# Patient Record
Sex: Male | Born: 1975 | Race: White | Hispanic: No | Marital: Married | Smoking: Former smoker
Health system: Southern US, Community
[De-identification: ages and names within clinical notes are randomized; demographics above are authoritative.]

## PROBLEM LIST (undated history)

## (undated) DIAGNOSIS — F329 Major depressive disorder, single episode, unspecified: Secondary | ICD-10-CM

## (undated) DIAGNOSIS — IMO0002 Reserved for concepts with insufficient information to code with codable children: Secondary | ICD-10-CM

## (undated) DIAGNOSIS — M1990 Unspecified osteoarthritis, unspecified site: Secondary | ICD-10-CM

## (undated) DIAGNOSIS — F32A Depression, unspecified: Secondary | ICD-10-CM

## (undated) DIAGNOSIS — F319 Bipolar disorder, unspecified: Secondary | ICD-10-CM

## (undated) DIAGNOSIS — F419 Anxiety disorder, unspecified: Secondary | ICD-10-CM

## (undated) DIAGNOSIS — E785 Hyperlipidemia, unspecified: Secondary | ICD-10-CM

## (undated) DIAGNOSIS — F1020 Alcohol dependence, uncomplicated: Secondary | ICD-10-CM

## (undated) DIAGNOSIS — K219 Gastro-esophageal reflux disease without esophagitis: Secondary | ICD-10-CM

## (undated) HISTORY — DX: Unspecified osteoarthritis, unspecified site: M19.90

## (undated) HISTORY — DX: Anxiety disorder, unspecified: F41.9

## (undated) HISTORY — DX: Hyperlipidemia, unspecified: E78.5

## (undated) HISTORY — DX: Alcohol dependence, uncomplicated: F10.20

## (undated) HISTORY — DX: Reserved for concepts with insufficient information to code with codable children: IMO0002

## (undated) HISTORY — DX: Depression, unspecified: F32.A

## (undated) HISTORY — DX: Gastro-esophageal reflux disease without esophagitis: K21.9

## (undated) HISTORY — PX: WISDOM TOOTH EXTRACTION: SHX21

## (undated) HISTORY — DX: Major depressive disorder, single episode, unspecified: F32.9

---

## 2008-04-25 ENCOUNTER — Emergency Department (HOSPITAL_COMMUNITY): Admission: EM | Admit: 2008-04-25 | Discharge: 2008-04-25 | Payer: Self-pay | Admitting: Emergency Medicine

## 2008-11-06 ENCOUNTER — Emergency Department (HOSPITAL_COMMUNITY): Admission: EM | Admit: 2008-11-06 | Discharge: 2008-11-06 | Payer: Self-pay | Admitting: Emergency Medicine

## 2009-05-31 ENCOUNTER — Ambulatory Visit: Payer: Self-pay | Admitting: Family Medicine

## 2009-05-31 DIAGNOSIS — R209 Unspecified disturbances of skin sensation: Secondary | ICD-10-CM | POA: Insufficient documentation

## 2009-05-31 DIAGNOSIS — R5381 Other malaise: Secondary | ICD-10-CM

## 2009-05-31 DIAGNOSIS — R0609 Other forms of dyspnea: Secondary | ICD-10-CM | POA: Insufficient documentation

## 2009-05-31 DIAGNOSIS — R0989 Other specified symptoms and signs involving the circulatory and respiratory systems: Secondary | ICD-10-CM

## 2009-05-31 DIAGNOSIS — R5383 Other fatigue: Secondary | ICD-10-CM | POA: Insufficient documentation

## 2009-05-31 DIAGNOSIS — IMO0002 Reserved for concepts with insufficient information to code with codable children: Secondary | ICD-10-CM | POA: Insufficient documentation

## 2009-06-10 ENCOUNTER — Ambulatory Visit: Payer: Self-pay | Admitting: Family Medicine

## 2009-06-10 DIAGNOSIS — E559 Vitamin D deficiency, unspecified: Secondary | ICD-10-CM | POA: Insufficient documentation

## 2009-06-10 LAB — CONVERTED CEMR LAB
AST: 19 units/L (ref 0–37)
Albumin: 4.6 g/dL (ref 3.5–5.2)
Alkaline Phosphatase: 46 units/L (ref 39–117)
Basophils Absolute: 0 10*3/uL (ref 0.0–0.1)
Basophils Relative: 0 % (ref 0–1)
Calcium: 9.4 mg/dL (ref 8.4–10.5)
Chloride: 103 meq/L (ref 96–112)
Creatinine, Ser: 0.88 mg/dL (ref 0.40–1.50)
HDL: 45 mg/dL (ref >39)
Hemoglobin: 14.9 g/dL (ref 13.0–17.0)
Lymphocytes Relative: 31 % (ref 12–46)
MCHC: 34 g/dL (ref 30.0–36.0)
Monocytes Absolute: 0.6 10*3/uL (ref 0.1–1.0)
Monocytes Relative: 8 % (ref 3–12)
Neutro Abs: 4.4 10*3/uL (ref 1.7–7.7)
Neutrophils Relative %: 60 % (ref 43–77)
RBC: 4.92 M/uL (ref 4.22–5.81)
Sed Rate: 3 mm/hr (ref 0–16)
Sodium: 139 meq/L (ref 135–145)
TSH: 0.917 microintl units/mL (ref 0.350–4.500)
Total CHOL/HDL Ratio: 3.4
Total Protein: 7.4 g/dL (ref 6.0–8.3)
Triglycerides: 91 mg/dL (ref <150)

## 2010-04-22 NOTE — Assessment & Plan Note (Signed)
Summary: NEW PATIENT PHYSICAL///SPH   Vital Signs:  Patient profile:   35 year old male Height:      74 inches Weight:      192 pounds BMI:     24.74 Temp:     98.1 degrees F oral Pulse rate:   72 / minute Pulse rhythm:   regular BP sitting:   126 / 84  (left arm) Cuff size:   regular  Vitals Entered By: Army Fossa CMA (May 31, 2009 2:17 PM) CC: Pt here for to establish and CPX, having a numbing sensation on outside of his leg.    History of Present Illness: Pt here to establish.  Pt had an injury at work when he picked something up and he saw a Dr in [place].   This occurred six years ago.  Pt c/o fatigue and numbnesss in hands and legs.  Pt feels like this has been getting progressivley worse. Pt wife states he snores a lot.  Back aggrevated back about 1 month ago.  He is not sure how he injured it  but it is progressively getting worse.  Pt works for [employer]---  He doesn't lift anything heavy or operate heavy machinery.   Pt tried PT 6 years ago.    Preventive Screening-Counseling & Management  Alcohol-Tobacco     Alcohol drinks/day: <1     Alcohol type: beer     Smoking Status: current     Smoking Cessation Counseling: yes     Smoke Cessation Stage: ready     Packs/Day: 0.5     Year Started: 1997  Caffeine-Diet-Exercise     Caffeine use/day: 2     Does Patient Exercise: yes     Times/week: <3---- 1x a week      Sexual History:  currently monogamous.        Drug Use:  no.    Current Medications (verified): 1)  Depakote 500 Mg Tbec (Divalproex Sodium) .... 2 By Mouth Qpm 2)  Lamictal Xr 100 Mg Xr24h-Tab (Lamotrigine) .Marland Kitchen.. 1 By Mouth At Bedtime 3)  Mobic 15 Mg Tabs (Meloxicam) .Marland Kitchen.. 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 Family History of Arthritis Family History of Colon CA 1st degree relative <60 Family History Diabetes 1st degree relative Family History Lung cancer Family History of Prostate CA 1st degree relative <50  Social History: Reviewed history and no changes required. 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 MS:  Complains of low back pain; denies  joint pain, joint redness, joint swelling, loss of strength, mid back pain, muscle aches, muscle , cramps, muscle weakness, stiffness, and thoracic pain. Psych:  Complains of depression; crossroads--. Allergy:  Denies hives or rash, itching eyes, persistent infections, seasonal allergies, and sneezing.  Physical Exam  General:  Well-developed,well-nourished,in no acute distress; alert,appropriate and cooperative throughout examination Neck:  No deformities, masses, or tenderness noted. Lungs:  Normal respiratory effort, chest expands symmetrically. Lungs are clear to auscultation, no crackles or  wheezes. Heart:  Normal rate and regular rhythm. S1 and S2 normal without gallop, murmur, click, rub or other extra sounds. Msk:  thumb and 1st finger numb with flexion of wrist-hand handed Extremities:  No clubbing, cyanosis, edema, or deformity noted with normal full range of motion of all joints.   Neurologic:  alert & oriented X3, strength normal in all extremities, gait normal, and DTRs symmetrical and normal.   Skin:  Intact without suspicious lesions or rashes Cervical Nodes:  No lymphadenopathy noted Psych:  Oriented X3, normally interactive, and good eye contact.     Impression & Recommendations:  Problem # 1:  NUMBNESS, HAND (ICD-782.0)  Orders: Venipuncture (56213) T-Antinuclear Antib (ANA) (08657-84696) Ankle / Wrist Splint (E9528)  Problem # 2:  BACK PAIN WITH RADICULOPATHY (ICD-729.2)  Orders: Venipuncture (41324) T-Antinuclear Antib (ANA) (40102-72536) T-Lumbar Spine 2 Views (72100TC) T-Vitamin D (25-Hydroxy) (64403-47425)  Problem # 3:  FATIGUE (ICD-780.79)  Orders: Venipuncture (95638) T-Antinuclear Antib (ANA) [phone]) T-Vitamin D (25-Hydroxy) (88416-60630) Sleep Disorder Referral (Sleep Disorder)  Problem # 4:  SNORING (ICD-786.09)  Orders: Sleep Disorder Referral (Sleep Disorder)  Complete Medication List: 1)  Depakote 500 Mg Tbec (Divalproex sodium) .... 2 by mouth qpm 2)  Lamictal Xr 100 Mg Xr24h-tab (Lamotrigine) .Marland Kitchen.. 1 by mouth at bedtime 3)  Mobic 15 Mg Tabs (Meloxicam) .Marland Kitchen.. 1 by mouth once daily Prescriptions: MOBIC 15 MG TABS (MELOXICAM) 1 by mouth once daily  #30 x 2   Entered and Authorized by:   Loreen Freud DO   Signed by:   Loreen Freud DO on 05/31/2009   Method used:   Electronically to        [facility] [phone]* (retail)       [address]       [place], [place]  [address]       Ph: [phone]       Fax: [phone]   RxID:   [id number]    Immunization History:  Tetanus/Td  Immunization History:    Tetanus/Td:  Historical (05/30/2003)

## 2010-04-22 NOTE — Assessment & Plan Note (Signed)
Summary: cpx//lch   Vital Signs:  Patient profile:   35 year old male Temp:     190 degrees F Pulse rate:   78 / minute Pulse rhythm:   regular BP sitting:   124 / 80  (left arm) Cuff size:   regular  Vitals Entered By: Army Fossa CMA (June 10, 2009 8:53 AM) CC: CPX, no complaints.    History of Present Illness: Pt here for cpe .  Labs reviewed with patient.  No new complaints.    Preventive Screening-Counseling & Management  Alcohol-Tobacco     Alcohol drinks/day: <1     Alcohol type: beer     Smoking Status: current     Smoking Cessation Counseling: yes     Smoke Cessation Stage: ready     Packs/Day: 0.5     Year Started: 1997  Caffeine-Diet-Exercise     Caffeine use/day: 2     Does Patient Exercise: yes     Times/week: <3---- 1x a week  Hep-HIV-STD-Contraception     Dental Visit-last 6 months no     Dental Care Counseling: to seek dental care; no dental care within six months      Sexual History:  currently monogamous and married-- 1 child.    Current Medications (verified): 1)  Depakote 500 Mg Tbec (Divalproex Sodium) .... 2 By Mouth Qpm 2)  Lamictal Xr 100 Mg Xr24h-Tab (Lamotrigine) .Marland Kitchen.. 1 By Mouth At Bedtime 3)  Mobic 15 Mg Tabs (Meloxicam) .Marland Kitchen.. 1 By Mouth Once Daily 4)  Vitamin D (Ergocalciferol) 50000 Unit Caps (Ergocalciferol) .... Take One Tablet Weekly  Allergies (verified): No Known Drug Allergies  Past History:  Past Medical History: Last updated: 05/31/2009 Arthritis Depression GERD Ulcers  Family History: Last updated: 05/31/2009 Family History of Arthritis Family History of Colon CA 1st degree relative <60 Family History Diabetes 1st degree relative Family History Lung cancer Family History of Prostate CA 1st degree relative <50  Social History: Last updated: 05/31/2009 Married Current Smoker Alcohol use-yes Drug use-no Regular exercise-no Occupation: Barrister's clerk--- apprentice/ electrician  Risk Factors: Alcohol Use:  <1 (06/10/2009) Caffeine Use: 2 (06/10/2009) Exercise: yes (06/10/2009)  Risk Factors: Smoking Status: current (06/10/2009) Packs/Day: 0.5 (06/10/2009)  Past Surgical History: wisdom teeth  Family History: Reviewed history from 05/31/2009 and no changes required. Family History of Arthritis Family History of Colon CA 1st degree relative <60 Family History Diabetes 1st degree relative Family History Lung cancer Family History of Prostate CA 1st degree relative <50  Social History: Reviewed history from 05/31/2009 and no changes required. Married Current Smoker Alcohol use-yes Drug use-no Regular exercise-no Occupation: Barrister's clerk--- apprentice/ electrician Dental Care w/in 6 mos.:  no Sexual History:  currently monogamous, married-- 1 child  Review of Systems      See HPI General:  Denies chills, fatigue, fever, loss of appetite, malaise, sleep disorder, sweats, weakness, and weight loss. Eyes:  Denies blurring, discharge, double vision, eye irritation, eye pain, halos, itching, light sensitivity, red eye, vision loss-1 eye, and vision loss-both eyes; optho--due. ENT:  Denies decreased hearing, difficulty swallowing, ear discharge, earache, hoarseness, nasal congestion, nosebleeds, postnasal drainage, ringing in ears, sinus pressure, and sore throat. CV:  Denies bluish discoloration of lips or nails, chest pain or discomfort, difficulty breathing at night, difficulty breathing while lying down, fainting, fatigue, leg cramps with exertion, lightheadness, near fainting, palpitations, shortness of breath with exertion, swelling of feet, swelling of hands, and weight gain. Resp:  Denies chest discomfort, chest pain with inspiration, cough,  coughing up blood, excessive snoring, hypersomnolence, morning headaches, pleuritic, shortness of breath, sputum productive, and wheezing. GI:  Denies abdominal pain, bloody stools, change in bowel habits, constipation, dark tarry stools,  diarrhea, excessive appetite, gas, hemorrhoids, indigestion, loss of appetite, nausea, vomiting, vomiting blood, and yellowish skin color. GU:  Denies decreased libido, discharge, dysuria, erectile dysfunction, genital sores, hematuria, incontinence, nocturia, urinary frequency, and urinary hesitancy. MS:  Complains of low back pain; denies joint pain, joint redness, joint swelling, loss of strength, mid back pain, muscle aches, muscle , cramps, muscle weakness, stiffness, and thoracic pain. Derm:  Denies changes in color of skin, changes in nail beds, dryness, excessive perspiration, flushing, hair loss, insect bite(s), itching, lesion(s), poor wound healing, and rash. Neuro:  Denies brief paralysis, difficulty with concentration, disturbances in coordination, falling down, headaches, inability to speak, memory loss, numbness, poor balance, seizures, sensation of room spinning, tingling, tremors, visual disturbances, and weakness. Psych:  Denies alternate hallucination ( auditory/visual), anxiety, depression, easily angered, easily tearful, irritability, mental problems, panic attacks, sense of great danger, suicidal thoughts/plans, thoughts of violence, unusual visions or sounds, and thoughts /plans of harming others. Endo:  Denies cold intolerance, excessive hunger, excessive thirst, excessive urination, heat intolerance, polyuria, and weight change. Heme:  Denies abnormal bruising, bleeding, enlarge lymph nodes, fevers, pallor, and skin discoloration. Allergy:  Denies hives or rash, itching eyes, persistent infections, seasonal allergies, and sneezing.  Physical Exam  General:  Well-developed,well-nourished,in no acute distress; alert,appropriate and cooperative throughout examination Head:  Normocephalic and atraumatic without obvious abnormalities. No apparent alopecia or balding. Eyes:  vision grossly intact, pupils equal, pupils round, pupils reactive to light, and no injection.   Ears:   External ear exam shows no significant lesions or deformities.  Otoscopic examination reveals clear canals, tympanic membranes are intact bilaterally without bulging, retraction, inflammation or discharge. Hearing is grossly normal bilaterally. Nose:  External nasal examination shows no deformity or inflammation. Nasal mucosa are pink and moist without lesions or exudates. Mouth:  Oral mucosa and oropharynx without lesions or exudates.  Teeth in good repair. Neck:  No deformities, masses, or tenderness noted. Chest Wall:  No deformities, masses, tenderness or gynecomastia noted. Lungs:  Normal respiratory effort, chest expands symmetrically. Lungs are clear to auscultation, no crackles or wheezes. Heart:  normal rate and no murmur.   Abdomen:  Bowel sounds positive,abdomen soft and non-tender without masses, organomegaly or hernias noted. Rectal:  No external abnormalities noted. Normal sphincter tone. No rectal masses or tenderness. Genitalia:  Testes bilaterally descended without nodularity, tenderness or masses. No scrotal masses or lesions. No penis lesions or urethral discharge. Msk:  normal ROM, no joint tenderness, no joint swelling, no joint warmth, no redness over joints, no joint deformities, no joint instability, and no crepitation.   Pulses:  R posterior tibial normal, R dorsalis pedis normal, R carotid normal, L posterior tibial normal, L dorsalis pedis normal, and L carotid normal.   Extremities:  No clubbing, cyanosis, edema, or deformity noted with normal full range of motion of all joints.   Neurologic:  No cranial nerve deficits noted. Station and gait are normal. Plantar reflexes are down-going bilaterally. DTRs are symmetrical throughout. Sensory, motor and coordinative functions appear intact. Skin:  Intact without suspicious lesions or rashes Cervical Nodes:  No lymphadenopathy noted Psych:  Cognition and judgment appear intact. Alert and cooperative with normal attention span  and concentration. No apparent delusions, illusions, hallucinations   Impression & Recommendations:  Problem # 1:  PREVENTIVE HEALTH  CARE (ICD-V70.0) labs reviewed with pt  Reviewed preventive care protocols, scheduled due services, and updated immunizations.  Problem # 2:  UNSPECIFIED VITAMIN D DEFICIENCY (ICD-268.9) vita D 3 2000u daily recheck 3 month  Complete Medication List: 1)  Depakote 500 Mg Tbec (Divalproex sodium) .... 2 by mouth qpm 2)  Lamictal Xr 100 Mg Xr24h-tab (Lamotrigine) .Marland Kitchen.. 1 by mouth at bedtime 3)  Mobic 15 Mg Tabs (Meloxicam) .Marland Kitchen.. 1 by mouth once daily 4)  Vitamin D (ergocalciferol) 50000 Unit Caps (Ergocalciferol) .... Take one tablet weekly 5)  Vitamin D3 2000 Unit Caps (Cholecalciferol) .Marland Kitchen.. 1 by mouth once daily  Patient Instructions: 1)  Follow up  in 3 months for Vitamin D and B12. 266.2, 268.9    Flu Vaccine Next Due:  Refused

## 2010-05-31 ENCOUNTER — Encounter: Payer: Self-pay | Admitting: Family Medicine

## 2010-06-13 ENCOUNTER — Encounter: Payer: Self-pay | Admitting: Family Medicine

## 2010-06-13 DIAGNOSIS — Z0289 Encounter for other administrative examinations: Secondary | ICD-10-CM

## 2010-07-11 ENCOUNTER — Encounter: Payer: Self-pay | Admitting: Family Medicine

## 2010-07-15 ENCOUNTER — Encounter: Payer: Self-pay | Admitting: Family Medicine

## 2011-04-08 ENCOUNTER — Ambulatory Visit (INDEPENDENT_AMBULATORY_CARE_PROVIDER_SITE_OTHER): Payer: PRIVATE HEALTH INSURANCE | Admitting: Family Medicine

## 2011-04-08 ENCOUNTER — Encounter: Payer: Self-pay | Admitting: Family Medicine

## 2011-04-08 ENCOUNTER — Telehealth: Payer: Self-pay | Admitting: *Deleted

## 2011-04-08 DIAGNOSIS — J111 Influenza due to unidentified influenza virus with other respiratory manifestations: Secondary | ICD-10-CM | POA: Insufficient documentation

## 2011-04-08 DIAGNOSIS — IMO0002 Reserved for concepts with insufficient information to code with codable children: Secondary | ICD-10-CM

## 2011-04-08 DIAGNOSIS — J329 Chronic sinusitis, unspecified: Secondary | ICD-10-CM | POA: Insufficient documentation

## 2011-04-08 MED ORDER — OSELTAMIVIR PHOSPHATE 75 MG PO CAPS: 75.0000 mg | ORAL_CAPSULE | Freq: Two times a day (BID) | ORAL | Status: AC

## 2011-04-08 MED ORDER — TRAMADOL HCL 50 MG PO TABS: 50.0000 mg | ORAL_TABLET | Freq: Three times a day (TID) | ORAL | Status: DC | PRN

## 2011-04-08 MED ORDER — HYDROCODONE-ACETAMINOPHEN 5-500 MG PO TABS: 1.0000 | ORAL_TABLET | Freq: Four times a day (QID) | ORAL | Status: AC | PRN

## 2011-04-08 MED ORDER — CLARITHROMYCIN ER 500 MG PO TB24: 1000.0000 mg | ORAL_TABLET | Freq: Every day | ORAL | Status: DC

## 2011-04-08 NOTE — Patient Instructions (Signed)
This is the flu coupled w/ a sinus infection Take the Tamiflu twice a day Take 2 tabs of the Biaxin daily at the same time, w/ food Rest!! Plenty of fluids! Alternate tylenol and ibuprofen every 4 hrs for pain and fever Use the Ultram as needed for severe back pain Call with any questions or concerns Hang in there!!!!

## 2011-04-08 NOTE — Assessment & Plan Note (Signed)
Pt's sxs consistent w/ flu. Start tamiflu.  Reviewed supportive care and red flags that should prompt return. Pt expressed understanding and is in agreement w/ plan.  

## 2011-04-08 NOTE — Assessment & Plan Note (Signed)
Pt's sxs and PE consistent w/ sinus infxn.  Start abx.  Reviewed supportive care and red flags that should prompt return.  Pt expressed understanding and is in agreement w/ plan.  

## 2011-04-08 NOTE — Progress Notes (Signed)
  Subjective:    Patient ID: Dean Palmer, male    DOB: 08-24-1975, 36 y.o.   MRN: [id number]  HPI URI- wife and son both w/ bronchitis, mother-in-law w/ PNA.  sxs started Sunday w/ cough.  Monday had nausea, vomited x1.  Yesterday evening developed body aches, chills.  Cough is dry.  + low grade temps.  Severe back pain from cough- motrin, aleve, tylenol w/out relief.  + sinus pressure.  + HA.   Review of Systems For ROS see HPI     Objective:   Physical Exam  Vitals reviewed. Constitutional: He appears well-developed and well-nourished. No distress.  HENT:  Head: Normocephalic and atraumatic.  Right Ear: Tympanic membrane normal.  Left Ear: Tympanic membrane normal.  Nose: Mucosal edema and rhinorrhea present. Right sinus exhibits maxillary sinus tenderness and frontal sinus tenderness. Left sinus exhibits maxillary sinus tenderness and frontal sinus tenderness.  Mouth/Throat: Mucous membranes are normal. Oropharyngeal exudate and posterior oropharyngeal erythema present. No posterior oropharyngeal edema.       + PND  Eyes: Conjunctivae and EOM are normal. Pupils are equal, round, and reactive to light.  Neck: Normal range of motion. Neck supple.  Cardiovascular: Normal rate, regular rhythm and normal heart sounds.   Pulmonary/Chest: Effort normal and breath sounds normal. No respiratory distress. He has no wheezes.       + hacking cough  Lymphadenopathy:    He has no cervical adenopathy.  Skin: Skin is warm and dry.          Assessment & Plan:

## 2011-04-08 NOTE — Assessment & Plan Note (Signed)
Deteriorated due to cough.  Reports NSAIDs are ineffective.  Start Tramadol prn.  Pt expressed understanding and is in agreement w/ plan.

## 2011-04-08 NOTE — Telephone Encounter (Signed)
Pt called into office advising that he had been seen today for flu/back pain and noted he was given tramadol and he thought this medication had made him sick before and was not sure, pt went to get medication filled at pharmacy and spoke to pharmacist about the concern and he was advised to take the tramadol by itself first to see if that is what may make him sick, he took the tramadol and vomited, spoke to MD Tabori, and delegated verbal order to pt for Vicodin 500mg  take 1 tablet P.O. Q6hrs PRN and to advise this would all the vicodin that could be allowed per pt will need to follow up with primary/ortho. Pt understood

## 2011-04-14 ENCOUNTER — Ambulatory Visit (HOSPITAL_BASED_OUTPATIENT_CLINIC_OR_DEPARTMENT_OTHER)
Admission: RE | Admit: 2011-04-14 | Discharge: 2011-04-14 | Disposition: A | Discharge status: Home / Self Care | Payer: PRIVATE HEALTH INSURANCE | Source: Ambulatory Visit | Attending: Family Medicine | Admitting: Family Medicine

## 2011-04-14 ENCOUNTER — Ambulatory Visit (INDEPENDENT_AMBULATORY_CARE_PROVIDER_SITE_OTHER): Payer: PRIVATE HEALTH INSURANCE | Admitting: Family Medicine

## 2011-04-14 ENCOUNTER — Encounter: Payer: Self-pay | Admitting: Family Medicine

## 2011-04-14 VITALS — BP 116/74 | HR 77 | Temp 98.7°F | Wt 195.2 lb

## 2011-04-14 DIAGNOSIS — M549 Dorsalgia, unspecified: Secondary | ICD-10-CM

## 2011-04-14 DIAGNOSIS — R059 Cough, unspecified: Secondary | ICD-10-CM | POA: Insufficient documentation

## 2011-04-14 DIAGNOSIS — J40 Bronchitis, not specified as acute or chronic: Secondary | ICD-10-CM

## 2011-04-14 DIAGNOSIS — R05 Cough: Secondary | ICD-10-CM

## 2011-04-14 DIAGNOSIS — J329 Chronic sinusitis, unspecified: Secondary | ICD-10-CM

## 2011-04-14 MED ORDER — MELOXICAM 15 MG PO TABS: 15.0000 mg | ORAL_TABLET | Freq: Every day | ORAL | Status: DC

## 2011-04-14 MED ORDER — MOXIFLOXACIN HCL 400 MG PO TABS: 400.0000 mg | ORAL_TABLET | Freq: Every day | ORAL | Status: AC

## 2011-04-14 MED ORDER — FLUTICASONE FUROATE 27.5 MCG/SPRAY NA SUSP: 2.0000 | Freq: Every day | NASAL | Status: DC

## 2011-04-14 MED ORDER — BUDESONIDE-FORMOTEROL FUMARATE 160-4.5 MCG/ACT IN AERO: 2.0000 | INHALATION_SPRAY | Freq: Two times a day (BID) | RESPIRATORY_TRACT | Status: DC

## 2011-04-14 NOTE — Patient Instructions (Signed)

## 2011-04-14 NOTE — Progress Notes (Signed)
  Subjective:     Dean Palmer is a 36 y.o. male who presents for evaluation of sinus pain. Symptoms include: congestion, cough, facial pain, nasal congestion and sinus pressure. Onset of symptoms was 10 days ago. Symptoms have been gradually worsening since that time. Past history is significant for no history of pneumonia or bronchitis. Patient is a smoker  (< 1  Ppd).   The following portions of the patient's history were reviewed and updated as appropriate: allergies, current medications, past family history, past medical history, past social history, past surgical history and problem list. Review of Systems Pertinent items are noted in HPI.   Objective:    BP 116/74  Pulse 77  Temp(Src) 98.7 F (37.1 C) (Oral)  Wt 195 lb 3.2 oz (88.542 kg)  SpO2 98% General appearance: alert, cooperative, appears stated age and no distress Ears: normal TM's and external ear canals both ears Nose: green discharge, moderate congestion, sinus tenderness bilateral Throat: abnormal findings: marked oropharyngeal erythema Neck: mild anterior cervical adenopathy, supple, symmetrical, trachea midline and thyroid not enlarged, symmetric, no tenderness/mass/nodules Lungs: wheezes bilaterally Heart: regular rate and rhythm, S1, S2 normal, no murmur, click, rub or gallop Extremities: extremities normal, atraumatic, no cyanosis or edema    Assessment:    Acute bacterial sinusitis.   Acute bronchitis Plan:    avelox for 10 days cxr veramyst symbicort

## 2011-04-15 ENCOUNTER — Telehealth: Payer: Self-pay

## 2011-04-15 NOTE — Telephone Encounter (Signed)
msg from patient stating that he took the Meloxicam last night and had nausea. He stated he is still having the back pain and wants to try something else. Please advise   KP

## 2011-04-15 NOTE — Telephone Encounter (Signed)
Samples of celebrex can be left up front  1 a day

## 2011-04-15 NOTE — Telephone Encounter (Signed)
Patient made aware and agreed to pick up the samples     KP

## 2011-04-29 ENCOUNTER — Encounter: Payer: Self-pay | Admitting: Internal Medicine

## 2011-04-29 ENCOUNTER — Ambulatory Visit (INDEPENDENT_AMBULATORY_CARE_PROVIDER_SITE_OTHER): Payer: PRIVATE HEALTH INSURANCE | Admitting: Internal Medicine

## 2011-04-29 DIAGNOSIS — R509 Fever, unspecified: Secondary | ICD-10-CM

## 2011-04-29 DIAGNOSIS — N453 Epididymo-orchitis: Secondary | ICD-10-CM

## 2011-04-29 DIAGNOSIS — R1032 Left lower quadrant pain: Secondary | ICD-10-CM

## 2011-04-29 DIAGNOSIS — N451 Epididymitis: Secondary | ICD-10-CM

## 2011-04-29 LAB — POCT URINALYSIS DIPSTICK
Protein, UA: NEGATIVE
Urobilinogen, UA: 0.2

## 2011-04-29 MED ORDER — HYDROCODONE-ACETAMINOPHEN 7.5-500 MG PO TABS: 1.0000 | ORAL_TABLET | ORAL | Status: AC | PRN

## 2011-04-29 MED ORDER — CIPROFLOXACIN HCL 500 MG PO TABS: 500.0000 mg | ORAL_TABLET | Freq: Two times a day (BID) | ORAL | Status: AC

## 2011-04-29 NOTE — Progress Notes (Signed)
  Subjective:    Patient ID: Dean Palmer, male    DOB: June 29, 1975, 36 y.o.   MRN: [id number]  HPI  GROIN PAIN: Location: L inguinal area Onset: 2/4 as "twinge" or muscle pull after bathing  & lifting 42 # son out of tub   Radiation: L scrotum earlier today  Severity: initially 3-4; up to 9 on 2/5 intermittently w/o trigger Quality: sharp Duration: up to 60 min  Better with: no relief with NSAIDS; Vicodin helped for 3-4 hrs Worse with: no exacerbating factors Symptoms Nausea/Vomiting: yes, slight nausea  Diarrhea: no  Constipation: no  Melena/BRBPR: no  Anorexia: yes,   Fever/Chills: no  Dysuria/hematuria/pyuria: no  Rash: no  Wt loss: no  Past Surgeries: no appendectomy       Review of Systems   He recently been out of work with fluid which was complicated by bronchitis. He does not have any productive cough at this time or dyspnea     Objective:   Physical Exam General appearance; thin but in good health and nourishment. Uncomfortable but  w/o distress.  Eyes: No conjunctival inflammation or scleral icterus is present.  Oral exam: Dental hygiene is good; lips and gums are healthy appearing.There is no oropharyngeal erythema or exudate noted.   Heart:  Normal rate and regular rhythm. S1 and S2 normal without gallop, murmur, click, rub or other extra sounds     Lungs:Chest clear to auscultation; no wheezes, rhonchi,rales ,or rubs present.No increased work of breathing.   Abdomen: bowel sounds hyperactive, soft but tender  LLQ without masses, organomegaly or hernias noted.  No guarding or rebound . There is a slight bulge in the left lower quadrant with cough. This area is slightly tender there is no definite hernia present.  Skin:Warm & dry.  Intact without suspicious lesions or rashes ; no jaundice or tenting  Lymphatic: No lymphadenopathy is noted about the head, neck, axilla, or inguinal areas.   Genitourinary/digital rectal exam: No inguinal hernia is present on  the left. There is slight tenderness in the left epididymal area. Prostate is asymmetric with borderline enlargement of the right. He has no nodularity, induration, or tenderness the prostate. The adnexal areas reveal no tenderness to palpation. FOB negative             Assessment & Plan:   #1 left lower quadrant pain; abdominal wall etiology versus subclinical direct hernia. No clinical evidence of left-sided appendicitis.  #2 possible low-grade epididymitis on the left  #3 borderline right prostatic hypertrophy; no clinical prostatitis.  #4 low-grade fever  Plan: See orders and recommendations. He'll be treated with antibiotics; CBC and differential and urinalysis will be checked. If symptoms persist imaging may be necessary.

## 2011-04-29 NOTE — Progress Notes (Signed)
Addended by: Legrand Como on: 04/29/2011 04:23 PM   Modules accepted: Orders

## 2011-04-29 NOTE — Patient Instructions (Signed)
Theoretically spicy foods and alcohol may aggravate epididymitis. Soak in hot tub 2-3 X / day  may be of benefit. To ER if pain persists or is associaled with Warning Signs;  increasing pain, fever or rectal bleeding as discussed.  Stay on clear liquids for 48-72 hours or until pain gone.This would include  jello, sherbert (NOT ice cream), Lipton's chicken noodle soup(NOT cream based soups),Gatorade Lite, flat Ginger ale (without High Fructose Corn Syrup),dry toast or crackers, baked potato.No milk , dairy or grease until pain gone

## 2011-04-30 LAB — CBC WITH DIFFERENTIAL/PLATELET
Basophils Relative: 1 % (ref 0.0–3.0)
Eosinophils Absolute: 0.1 10*3/uL (ref 0.0–0.7)
MCHC: 34.3 g/dL (ref 30.0–36.0)
MCV: 90.4 fl (ref 78.0–100.0)
Monocytes Absolute: 0.4 10*3/uL (ref 0.1–1.0)
Neutrophils Relative %: 15.4 % — ABNORMAL LOW (ref 43.0–77.0)
Platelets: 194 10*3/uL (ref 150.0–400.0)
RBC: 4.59 Mil/uL (ref 4.22–5.81)

## 2011-05-01 LAB — URINE CULTURE: Colony Count: NO GROWTH

## 2011-07-10 ENCOUNTER — Encounter: Payer: PRIVATE HEALTH INSURANCE | Admitting: Family Medicine

## 2011-07-28 ENCOUNTER — Encounter: Payer: PRIVATE HEALTH INSURANCE | Admitting: Family Medicine

## 2011-10-14 ENCOUNTER — Encounter (HOSPITAL_BASED_OUTPATIENT_CLINIC_OR_DEPARTMENT_OTHER): Payer: Self-pay | Admitting: *Deleted

## 2011-10-14 ENCOUNTER — Emergency Department (HOSPITAL_BASED_OUTPATIENT_CLINIC_OR_DEPARTMENT_OTHER)
Admission: EM | Admit: 2011-10-14 | Discharge: 2011-10-14 | Disposition: A | Discharge status: Home / Self Care | Payer: BC Managed Care – PPO | Attending: Emergency Medicine | Admitting: Emergency Medicine

## 2011-10-14 DIAGNOSIS — M549 Dorsalgia, unspecified: Secondary | ICD-10-CM | POA: Insufficient documentation

## 2011-10-14 DIAGNOSIS — F319 Bipolar disorder, unspecified: Secondary | ICD-10-CM | POA: Insufficient documentation

## 2011-10-14 DIAGNOSIS — M129 Arthropathy, unspecified: Secondary | ICD-10-CM | POA: Insufficient documentation

## 2011-10-14 DIAGNOSIS — F17200 Nicotine dependence, unspecified, uncomplicated: Secondary | ICD-10-CM | POA: Insufficient documentation

## 2011-10-14 DIAGNOSIS — K219 Gastro-esophageal reflux disease without esophagitis: Secondary | ICD-10-CM | POA: Insufficient documentation

## 2011-10-14 HISTORY — DX: Bipolar disorder, unspecified: F31.9

## 2011-10-14 MED ORDER — HYDROCODONE-ACETAMINOPHEN 5-500 MG PO TABS: 1.0000 | ORAL_TABLET | Freq: Four times a day (QID) | ORAL | Status: AC | PRN

## 2011-10-14 MED ORDER — CYCLOBENZAPRINE HCL 10 MG PO TABS
10.0000 mg | ORAL_TABLET | Freq: Once | ORAL | Status: AC
  Administered 2011-10-14: 10 mg via ORAL
  Filled 2011-10-14: qty 1

## 2011-10-14 MED ORDER — KETOROLAC TROMETHAMINE 60 MG/2ML IM SOLN
60.0000 mg | Freq: Once | INTRAMUSCULAR | Status: AC
  Administered 2011-10-14: 60 mg via INTRAMUSCULAR
  Filled 2011-10-14: qty 2

## 2011-10-14 MED ORDER — CYCLOBENZAPRINE HCL 5 MG PO TABS: 5.0000 mg | ORAL_TABLET | Freq: Two times a day (BID) | ORAL | Status: DC | PRN

## 2011-10-14 NOTE — ED Provider Notes (Signed)
History/physical exam/procedure(s) were performed by non-physician practitioner and as supervising physician I was immediately available for consultation/collaboration. I have reviewed all notes and am in agreement with care and plan.   Hilario Quarry, MD 10/14/11 [phone]

## 2011-10-14 NOTE — ED Notes (Signed)
Patient states this morning went getting out of bed, he felt a twinge in his back and developed pain in the mid lower back.  Pain has progressively has gotten worse.

## 2011-10-14 NOTE — ED Provider Notes (Signed)
History     CSN: [id number]  Arrival date & time 10/14/11  1145   First MD Initiated Contact with Patient 10/14/11 1208      Chief Complaint  Patient presents with  . Back Pain    (Consider location/radiation/quality/duration/timing/severity/associated sxs/prior treatment) HPI Comments: Pt states that he has a history of back problems and this morning he started with recurrent pain when getting out of the bed  Patient is a 36 y.o. male presenting with back pain. The history is provided by the patient. No language interpreter was used.  Back Pain  This is a chronic problem. The current episode started 6 to 12 hours ago. The problem occurs constantly. The problem has not changed since onset.The pain is associated with twisting. The pain is present in the lumbar spine. The pain does not radiate. The pain is the same all the time. Pertinent negatives include no numbness, no bowel incontinence, no perianal numbness, no dysuria, no tingling and no weakness. He has tried nothing for the symptoms.    Past Medical History  Diagnosis Date  . Arthritis   . Depression   . GERD (gastroesophageal reflux disease)   . Ulcer   . Bipolar 1 disorder     Past Surgical History  Procedure Date  . Wisdom tooth extraction     Family History  Problem Relation Age of Onset  . Arthritis    . Diabetes Father   . Lung cancer      MG aunt  . Prostate cancer Paternal Grandfather     or possibly colon cancer    History  Substance Use Topics  . Smoking status: Current Some Day Smoker -- 0.2 packs/day    Types: Cigarettes  . Smokeless tobacco: Not on file  . Alcohol Use: No     none for 2 weeks       Review of Systems  Constitutional: Negative.   Respiratory: Negative.   Cardiovascular: Negative.   Gastrointestinal: Negative for bowel incontinence.  Genitourinary: Negative for dysuria.  Musculoskeletal: Positive for back pain.  Neurological: Negative for tingling, weakness and numbness.     Allergies  Tramadol  Home Medications   Current Outpatient Rx  Name Route Sig Dispense Refill  . BUDESONIDE-FORMOTEROL FUMARATE 160-4.5 MCG/ACT IN AERO Inhalation Inhale 2 puffs into the lungs 2 (two) times daily. 1 Inhaler 3  . VITAMIN D3 2000 UNITS PO CAPS Oral Take 2,000 Units by mouth daily.      Marland Kitchen DIVALPROEX SODIUM 500 MG PO TBEC Oral Take 500 mg by mouth. 2 tab every night     . FLUTICASONE FUROATE 27.5 MCG/SPRAY NA SUSP Nasal Place 2 sprays into the nose daily. 10 g 12  . LAMOTRIGINE ER 100 MG PO TB24 Oral Take by mouth at bedtime.      . MELOXICAM 15 MG PO TABS Oral Take 15 mg by mouth daily.      . MELOXICAM 15 MG PO TABS Oral Take 1 tablet (15 mg total) by mouth daily. 30 tablet 0  . VITAMIN D (ERGOCALCIFEROL) 50000 UNITS PO CAPS Oral Take 50,000 Units by mouth every 7 (seven) days.        BP 129/74  Pulse 92  Temp 98.3 F (36.8 C) (Oral)  Resp 20  SpO2 100%  Physical Exam  Nursing note and vitals reviewed. Constitutional: He is oriented to person, place, and time. He appears well-developed and well-nourished.  HENT:  Head: Normocephalic and atraumatic.  Cardiovascular: Normal rate and regular rhythm.  Pulmonary/Chest: Effort normal and breath sounds normal.  Musculoskeletal: Normal range of motion.       Pt has left lumbar pain with palpation:pt has full rom  Neurological: He is alert and oriented to person, place, and time. He exhibits normal muscle tone. Coordination normal.  Skin: Skin is warm and dry.    ED Course  Procedures (including critical care time)  Labs Reviewed - No data to display No results found.   1. Back pain       MDM  Pt is not having any neuro deficit, will treat symptomatically and have follow up for continued symptoms:don't think any imaging is needed at this time        Teressa Lower, NP 10/14/11 1345

## 2012-04-11 ENCOUNTER — Encounter: Payer: Self-pay | Admitting: Internal Medicine

## 2012-04-11 ENCOUNTER — Ambulatory Visit (INDEPENDENT_AMBULATORY_CARE_PROVIDER_SITE_OTHER): Payer: BC Managed Care – PPO | Admitting: Internal Medicine

## 2012-04-11 VITALS — BP 124/82 | HR 80 | Temp 98.7°F | Resp 12

## 2012-04-11 DIAGNOSIS — J029 Acute pharyngitis, unspecified: Secondary | ICD-10-CM

## 2012-04-11 DIAGNOSIS — M6580 Other synovitis and tenosynovitis, unspecified site: Secondary | ICD-10-CM

## 2012-04-11 DIAGNOSIS — Z20828 Contact with and (suspected) exposure to other viral communicable diseases: Secondary | ICD-10-CM

## 2012-04-11 DIAGNOSIS — M659 Synovitis and tenosynovitis, unspecified: Secondary | ICD-10-CM

## 2012-04-11 DIAGNOSIS — F319 Bipolar disorder, unspecified: Secondary | ICD-10-CM

## 2012-04-11 DIAGNOSIS — R509 Fever, unspecified: Secondary | ICD-10-CM

## 2012-04-11 LAB — POCT INFLUENZA A/B: Influenza A, POC: NEGATIVE

## 2012-04-11 LAB — POCT RAPID STREP A (OFFICE): Rapid Strep A Screen: NEGATIVE

## 2012-04-11 MED ORDER — BENZONATATE 200 MG PO CAPS: 200.0000 mg | ORAL_CAPSULE | Freq: Three times a day (TID) | ORAL | Status: DC | PRN

## 2012-04-11 MED ORDER — OSELTAMIVIR PHOSPHATE 75 MG PO CAPS: 75.0000 mg | ORAL_CAPSULE | Freq: Two times a day (BID) | ORAL | Status: DC

## 2012-04-11 NOTE — Patient Instructions (Addendum)
Perform the exercises for elbow pain  twice a day as discussed. Use an anti-inflammatory cream such as Aspercreme or Zostrix cream twice a day to the left elbow as needed. In lieu of this warm moist compresses or  hot water bottle can be used. Do not apply ice . Consider glucosamine sulfate 1500 mg daily for joint symptoms. Take this daily  for 3 months and then leave it off for 2 months. This will rehydrate the cartilages.  Plain Mucinex (NOT D) for thick secretions ;force NON dairy fluids .   Nasal cleansing in the shower as discussed with lather of mild shampoo.After 10 seconds wash off lather while  exhaling through nostrils. Make sure that all residual soap is removed to prevent irritation.  Use a Neti pot daily only  as needed for significant sinus congestion; going from open side to congested side . Plain Allegra (NOT D )  160 daily , Loratidine 10 mg , OR Zyrtec 10 mg @ bedtime  as needed for itchy eyes & sneezing. NSAIDS ( Aleve, Advil, Naproxen) or Tylenol every 4 hrs as needed for fever as discussed based on label recommendations .  Please remain out of work until  04/12/2012

## 2012-04-11 NOTE — Progress Notes (Signed)
  Subjective:    Patient ID: Dean Palmer, male    DOB: 06/08/1975, 37 y.o.   MRN: [id number]  HPI The respiratory tract symptoms began 04/09/12 as sore throat , difficulty swallowing,& dry cough Significant associated symptoms included frontal headache, facial pain& sore throat Symptoms not present included dental pain,  nasal purulence, earache , and otic discharge  Fever to 100.5 1/19 with  chills and sweats   Cough was associated with  shortness of breath only with paroxysmal cough  Cough was not associated with  wheezing   Itchy , watery eyes & sneezing were not noted.  Treatment with  NSAIDS & Nyquil was partially effective  There is no history of asthma. The patient had  quit smoking in 11/13  Son hospitalized with flu,Strep,& Coxsackie  He has not taken Flu shot. He was diagnosed with flu in 01/13 also.                Review of Systems Extremity pain Location:L  elbow Onset:4-6 weeks Trigger/injury:no Pain quality:sharp Pain severity: up to 10 Duration:5-15 min Radiation:no Exacerbating factors:making fist or lifting Treatment/response:NSAIDS with minimal help  Musculoskeletal:some aching with RTI but no  muscle cramps or pain; some  joint stiffness.No redness or swelling Skin:no rash, color/temp  change Neuro:some LUE weakness; elbow joint numbness  Heme:no lymphadenopathy; abnormal bruising or bleeding        Objective:   Physical Exam General appearance:good health ;well nourished; no acute distress or increased work of breathing is present.  No  lymphadenopathy about the head, neck, or axilla noted.  Eyes: No conjunctival inflammation or lid edema is present. There is no scleral icterus. Ears:  External ear exam shows no significant lesions or deformities.  Otoscopic examination reveals clear canals, tympanic membranes are intact bilaterally without bulging, retraction, inflammation or discharge. Nose:  External nasal examination shows no deformity  or inflammation. Nasal mucosa are pink and moist without lesions or exudates. No septal dislocation or deviation.No obstruction to airflow.  Oral exam: Fractured R maxillary tooth; lips and gums are healthy appearing.There is mild oropharyngeal erythema .No exudate noted.  Neck:  No deformities,  masses, or tenderness noted.   Supple with full range of motion without pain.  Heart:  Normal rate and regular rhythm. S1 and S2 normal without gallop, murmur, click, rub or other extra sounds.  Lungs:Chest clear to auscultation; no wheezes, rhonchi,rales ,or rubs present.No increased work of breathing.   Extremities:  No cyanosis, edema, or clubbing  noted . His pain with passive range of motion of left elbow. Pain is aggravated by making a fist and pronating the arm  Neuro: Strength and tone normal in upper extremities Skin: Warm & dry w/o jaundice or tenting.          Assessment & Plan:  #1 respiratory tract infection suggestion of a flulike syndrome. By history he's been exposed to coxsackie virus, strep, and influenza. He has not taken influenza shot. Nasal swab and beta strep test are negative.  #2 left elbow pain, tenosynovitis clinically  Plan: See orders and recommendations.Flu shot declined despite notification of mortality data in [place] with flu

## 2012-04-12 DIAGNOSIS — F319 Bipolar disorder, unspecified: Secondary | ICD-10-CM | POA: Insufficient documentation

## 2012-06-18 ENCOUNTER — Ambulatory Visit (INDEPENDENT_AMBULATORY_CARE_PROVIDER_SITE_OTHER): Payer: BC Managed Care – PPO | Admitting: Family Medicine

## 2012-06-18 VITALS — BP 124/74 | HR 80 | Temp 98.1°F | Resp 16 | Ht 75.38 in | Wt 198.0 lb

## 2012-06-18 DIAGNOSIS — Z0000 Encounter for general adult medical examination without abnormal findings: Secondary | ICD-10-CM

## 2012-06-18 LAB — POCT URINALYSIS DIPSTICK
Blood, UA: NEGATIVE
Glucose, UA: NEGATIVE
Ketones, UA: NEGATIVE
Spec Grav, UA: 1.02

## 2012-06-18 NOTE — Progress Notes (Signed)
[address]   [place], [place]  [address]   [phone]  Subjective:    Patient ID: Dean Palmer, male    DOB: Jun 12, 1975, 37 y.o.   MRN: [id number]  HPI This 37 y.o. male presents for evaluation of CPE.  PCP:  Laury Axon  Last physical 05/2011 at [facility]. Tetanus vaccine 4-5 years by Dr. Laury Axon.   Influenza vaccines never. No previous colonoscopy. Eye exam never; no glasses or contacts. Dental exam tooth extraction 2013.     Review of Systems  Constitutional: Negative for fever, chills, diaphoresis, activity change, appetite change, fatigue and unexpected weight change.  HENT: Negative for hearing loss, ear pain, nosebleeds, congestion, sore throat, facial swelling, rhinorrhea, sneezing, drooling, mouth sores, trouble swallowing, neck pain, neck stiffness, dental problem, voice change, postnasal drip, sinus pressure, tinnitus and ear discharge.   Eyes: Negative for photophobia, pain, discharge, redness, itching and visual disturbance.  Respiratory: Negative for apnea, cough, choking, chest tightness, shortness of breath, wheezing and stridor.   Cardiovascular: Negative for chest pain, palpitations and leg swelling.  Gastrointestinal: Negative for nausea, vomiting, abdominal pain, diarrhea, constipation, blood in stool, abdominal distention, anal bleeding and rectal pain.  Endocrine: Negative for cold intolerance, heat intolerance, polydipsia, polyphagia and polyuria.  Genitourinary: Negative for dysuria, urgency, frequency, hematuria, flank pain, decreased urine volume, discharge, penile swelling, scrotal swelling, enuresis, difficulty urinating, genital sores, penile pain and testicular pain.  Musculoskeletal: Negative for myalgias, back pain, joint swelling, arthralgias and gait problem.  Skin: Negative for color change, pallor, rash and wound.  Allergic/Immunologic: Negative for environmental allergies, food allergies and immunocompromised state.  Neurological: Negative  for dizziness, tremors, seizures, syncope, facial asymmetry, speech difficulty, weakness, light-headedness, numbness and headaches.  Hematological: Negative for adenopathy. Does not bruise/bleed easily.  Psychiatric/Behavioral: Negative for suicidal ideas, hallucinations, behavioral problems, confusion, sleep disturbance, self-injury, dysphoric mood, decreased concentration and agitation. The patient is not nervous/anxious and is not hyperactive.         Past Medical History  Diagnosis Date  . GERD (gastroesophageal reflux disease)   . Bipolar 1 disorder   . Arthritis     DDD Lumbar  . Ulcer     associated with alcohol use  . Depression     followed by Tomasa Rand; followed every six months to nine months.    Past Surgical History  Procedure Laterality Date  . Wisdom tooth extraction      Prior to Admission medications   Medication Sig Start Date End Date Taking? Authorizing Provider  divalproex (DEPAKOTE) 500 MG EC tablet Take 500 mg by mouth. 2 tab every night    Yes Historical Provider, MD  LamoTRIgine (LAMICTAL XR) 100 MG TB24 Take by mouth at bedtime.     Yes Historical Provider, MD    Allergies  Allergen Reactions  . Tramadol Nausea Only    History   Social History  . Marital Status: Married    Spouse Name: N/A    Number of Children: N/A  . Years of Education: N/A   Occupational History  . apprentice//electrician [employer]   Social History Main Topics  . Smoking status: Former Smoker -- 0.25 packs/day    Types: Cigarettes    Start date: 03/20/2012  . Smokeless tobacco: Not on file  . Alcohol Use: No     Comment: none for 2 weeks   . Drug Use: No  . Sexually Active: Yes   Other Topics Concern  . Not on file   Social  History Narrative   Marital status: married x 6 years; happily married.      Children: 1 son (33 yo)      Lives: with wife, son, mother-in-law.      Employment:  Personnel officer x 2000; moderately happy.      Tobacco:  Quit  03/2012; smoked x 15 years.  Quit 2004-2007.       Alcohol:  Rarely now; previous heavier use.      Drugs: none; previous marijuana years ago.       Exercise:  None      Seatbelt: 100%      Sunscreen: SPF 15      Guns: none    Family History  Problem Relation Age of Onset  . Arthritis    . Lung cancer      MG aunt  . Diabetes Father   . Prostate cancer Paternal Grandfather     or possibly colon cancer  . Asthma Mother   . COPD Son     Objective:   Physical Exam  Nursing note and vitals reviewed. Constitutional: He is oriented to person, place, and time. He appears well-developed and well-nourished. No distress.  HENT:  Head: Normocephalic and atraumatic.  Right Ear: External ear normal.  Left Ear: External ear normal.  Nose: Nose normal.  Mouth/Throat: Oropharynx is clear and moist.  Eyes: Conjunctivae and EOM are normal. Pupils are equal, round, and reactive to light.  Neck: Normal range of motion. Neck supple. No JVD present. No thyromegaly present.  Cardiovascular: Normal rate, regular rhythm, normal heart sounds and intact distal pulses.  Exam reveals no gallop and no friction rub.   No murmur heard. Pulmonary/Chest: Effort normal and breath sounds normal. No respiratory distress. He has no wheezes. He has no rales.  Abdominal: Soft. Bowel sounds are normal. He exhibits no distension and no mass. There is no tenderness. There is no rebound and no guarding. Hernia confirmed negative in the right inguinal area and confirmed negative in the left inguinal area.  Genitourinary: Testes normal and penis normal. Right testis shows no mass, no swelling and no tenderness. Left testis shows no mass, no swelling and no tenderness.  Musculoskeletal:       Right shoulder: Normal.       Left shoulder: Normal.       Cervical back: Normal.       Thoracic back: Normal.       Lumbar back: Normal.  Lymphadenopathy:    He has no cervical adenopathy.       Right: No inguinal adenopathy  present.       Left: No inguinal adenopathy present.  Neurological: He is alert and oriented to person, place, and time. He has normal reflexes. No cranial nerve deficit. He exhibits normal muscle tone. Coordination normal.  Skin: Skin is warm and dry. No rash noted. He is not diaphoretic. No erythema. No pallor.  Psychiatric: He has a normal mood and affect. His behavior is normal. Judgment and thought content normal.    Results for orders placed in visit on 06/18/12  POCT URINALYSIS DIPSTICK      Result Value Range   Color, UA yellow     Clarity, UA clear     Glucose, UA neg     Bilirubin, UA neg     Ketones, UA neg     Spec Grav, UA 1.020     Blood, UA neg     pH, UA 8.5     Protein,  UA neg     Urobilinogen, UA 0.2     Nitrite, UA neg     Leukocytes, UA Negative         Assessment & Plan:  Annual physical exam - Plan: POCT urinalysis dipstick    1. CPE:  Anticipatory guidance --- exercise,seatbelt, sunscreen.  Immunizations UTD.  Recent labs obtained through employer.

## 2012-06-18 NOTE — Patient Instructions (Addendum)
Annual physical exam - Plan: POCT urinalysis dipstick

## 2012-06-22 ENCOUNTER — Encounter: Payer: Self-pay | Admitting: Family Medicine

## 2012-06-22 ENCOUNTER — Ambulatory Visit (INDEPENDENT_AMBULATORY_CARE_PROVIDER_SITE_OTHER): Payer: BC Managed Care – PPO | Admitting: Family Medicine

## 2012-06-22 VITALS — BP 108/60 | HR 97 | Temp 98.1°F | Ht 75.38 in | Wt 198.6 lb

## 2012-06-22 DIAGNOSIS — IMO0002 Reserved for concepts with insufficient information to code with codable children: Secondary | ICD-10-CM

## 2012-06-22 DIAGNOSIS — J329 Chronic sinusitis, unspecified: Secondary | ICD-10-CM

## 2012-06-22 MED ORDER — CLARITHROMYCIN ER 500 MG PO TB24: 1000.0000 mg | ORAL_TABLET | Freq: Every day | ORAL | Status: AC

## 2012-06-22 MED ORDER — CYCLOBENZAPRINE HCL 5 MG PO TABS: 5.0000 mg | ORAL_TABLET | Freq: Two times a day (BID) | ORAL | Status: AC | PRN

## 2012-06-22 MED ORDER — NAPROXEN 500 MG PO TABS: 500.0000 mg | ORAL_TABLET | Freq: Two times a day (BID) | ORAL | Status: DC

## 2012-06-22 NOTE — Patient Instructions (Addendum)
Take the Biaxin- 2 tabs at the same time for your sinus infection Drink plenty of fluids REST! Mucinex DM for cough and congestion Take the flexeril for muscle spasm Use a heating pad Naproxen twice daily- take w/ food.  You can add tylenol as needed but no additional ibuprofen, motrin, aleve, etc We'll call you with your ortho appt Feel Better Soon!

## 2012-06-22 NOTE — Assessment & Plan Note (Signed)
New.  Pt's sxs and PE consistent w/ infxn.  Start abx.  Reviewed supportive care and red flags that should prompt return.  Pt expressed understanding and is in agreement w/ plan.  

## 2012-06-22 NOTE — Assessment & Plan Note (Signed)
New to provider, ongoing for pt.  Pt has not seen ortho previously.  Will refer.  Pt and spouse have had difficulty w/ controlled substances in the past- will not prescribe at this time.  Start scheduled NSAIDs and muscle relaxers prn.  Reviewed supportive care and red flags that should prompt return.  Pt expressed understanding and is in agreement w/ plan.

## 2012-06-22 NOTE — Progress Notes (Signed)
  Subjective:    Patient ID: Dean Palmer, male    DOB: 01/22/1976, 37 y.o.   MRN: [id number]  HPI URI- sxs started Sunday.  + sick contacts (son).  Went to work Monday despite not feeling well.  Yesterday had cough productive of green/yellow sputum.  + nausea, diarrhea.  Vomited mucous yesterday.  + HA (frontal) since Monday AM.  + nasal congestion, dizziness, sinus pressure.  Tm 101.  Fell yesterday due to dizzness and hit center of back w/ pain radiating into L hip.  Hx of back pain w/ radiculopathy.   Review of Systems For ROS see HPI     Objective:   Physical Exam  Vitals reviewed. Constitutional: He appears well-developed and well-nourished. No distress.  HENT:  Head: Normocephalic and atraumatic.  Right Ear: Tympanic membrane normal.  Left Ear: Tympanic membrane normal.  Nose: Mucosal edema and rhinorrhea present. Right sinus exhibits maxillary sinus tenderness and frontal sinus tenderness. Left sinus exhibits maxillary sinus tenderness and frontal sinus tenderness.  Mouth/Throat: Mucous membranes are normal. Oropharyngeal exudate and posterior oropharyngeal erythema present. No posterior oropharyngeal edema.  + PND  Eyes: Conjunctivae and EOM are normal. Pupils are equal, round, and reactive to light.  Neck: Normal range of motion. Neck supple.  Cardiovascular: Normal rate, regular rhythm and normal heart sounds.   Pulmonary/Chest: Effort normal and breath sounds normal. No respiratory distress. He has no wheezes.  + hacking cough  Musculoskeletal:  Pain w/ both flexion and extension w/ reported radicular pain on L (-) SLR bilaterally Antalgic gait  Lymphadenopathy:    He has no cervical adenopathy.  Skin: Skin is warm and dry.          Assessment & Plan:

## 2012-08-05 ENCOUNTER — Ambulatory Visit (INDEPENDENT_AMBULATORY_CARE_PROVIDER_SITE_OTHER): Payer: BC Managed Care – PPO | Admitting: Family

## 2012-08-05 ENCOUNTER — Encounter: Payer: Self-pay | Admitting: Family

## 2012-08-05 VITALS — BP 114/76 | HR 66 | Temp 98.3°F | Resp 16 | Wt 200.1 lb

## 2012-08-05 DIAGNOSIS — J069 Acute upper respiratory infection, unspecified: Secondary | ICD-10-CM | POA: Insufficient documentation

## 2012-08-05 DIAGNOSIS — J020 Streptococcal pharyngitis: Secondary | ICD-10-CM

## 2012-08-05 DIAGNOSIS — J029 Acute pharyngitis, unspecified: Secondary | ICD-10-CM

## 2012-08-05 LAB — POCT RAPID STREP A (OFFICE): Rapid Strep A Screen: NEGATIVE

## 2012-08-05 MED ORDER — BENZONATATE 100 MG PO CAPS: 100.0000 mg | ORAL_CAPSULE | Freq: Three times a day (TID) | ORAL | Status: DC | PRN

## 2012-08-05 NOTE — Patient Instructions (Addendum)
Rapid strep is negative.   You can try tessalon as needed for cough, motrin as needed for  fever, muscle pain, drink plenty of fluids, call if symptoms worsen, if fever >101 or if not improved in 2-3 days.

## 2012-08-05 NOTE — Progress Notes (Signed)
Subjective:    Patient ID: Dean Palmer, male    DOB: 07/25/1975, 37 y.o.   MRN: [id number]  HPI  Dean Palmer is a 37 yr old male who presents today with chief complaint of Cough.  He reports that symptoms started yesterday morning.  Reports "cold chills."   Cough is described as dry. Reports mild associated nasal congestion and sore throat.  He has had some sick contacts at work. Reports some mild nausea and diarrhea.  Some anorexia.      Review of Systems See HPI  Past Medical History  Diagnosis Date  . GERD (gastroesophageal reflux disease)   . Bipolar 1 disorder   . Arthritis     DDD Lumbar  . Ulcer     associated with alcohol use  . Depression     followed by Dean Palmer; followed every six months to nine months.    History   Social History  . Marital Status: Married    Spouse Name: N/A    Number of Children: N/A  . Years of Education: N/A   Occupational History  . apprentice//electrician [employer]   Social History Main Topics  . Smoking status: Former Smoker -- 0.25 packs/day    Types: Cigarettes    Start date: 03/20/2012  . Smokeless tobacco: Not on file  . Alcohol Use: No     Comment: none for 2 weeks   . Drug Use: No  . Sexually Active: Yes   Other Topics Concern  . Not on file   Social History Narrative   Marital status: married x 6 years; happily married.      Children: 1 son (5 yo)      Lives: with wife, son, mother-in-law.      Employment:  Personnel officer x 2000; moderately happy.      Tobacco:  Quit 03/2012; smoked x 15 years.  Quit 2004-2007.       Alcohol:  Rarely now; previous heavier use.      Drugs: none; previous marijuana years ago.       Exercise:  None      Seatbelt: 100%      Sunscreen: SPF 15      Guns: none    Past Surgical History  Procedure Laterality Date  . Wisdom tooth extraction      Family History  Problem Relation Age of Onset  . Arthritis    . Lung cancer      MG aunt  . Diabetes Father   .  Prostate cancer Paternal Grandfather     or possibly colon cancer  . Asthma Mother   . COPD Son     Allergies  Allergen Reactions  . Tramadol Nausea Only    Current Outpatient Prescriptions on File Prior to Visit  Medication Sig Dispense Refill  . divalproex (DEPAKOTE) 500 MG EC tablet Take 500 mg by mouth. 2 tab every night       . LamoTRIgine (LAMICTAL XR) 100 MG TB24 Take by mouth at bedtime.        . naproxen (NAPROSYN) 500 MG tablet Take 1 tablet (500 mg total) by mouth 2 (two) times daily with a meal.  60 tablet  0   No current facility-administered medications on file prior to visit.    BP 114/76  Pulse 66  Temp(Src) 98.3 F (36.8 C) (Oral)  Resp 16  Wt 200 lb 1.9 oz (90.774 kg)  BMI 24.75 kg/m2  SpO2 99%  Objective:   Physical Exam  Constitutional: He is oriented to person, place, and time. He appears well-developed and well-nourished. No distress.  HENT:  Head: Normocephalic and atraumatic.  Right Ear: Tympanic membrane and ear canal normal.  Left Ear: Tympanic membrane and ear canal normal.  Mouth/Throat: Posterior oropharyngeal erythema present. No oropharyngeal exudate, posterior oropharyngeal edema or tonsillar abscesses.  Cardiovascular: Normal rate and regular rhythm.   No murmur heard. Pulmonary/Chest: Effort normal and breath sounds normal. No respiratory distress. He has no wheezes. He has no rales. He exhibits no tenderness.  Lymphadenopathy:    He has no cervical adenopathy.  Neurological: He is alert and oriented to person, place, and time.  Skin: Skin is warm and dry.  Psychiatric: He has a normal mood and affect. His behavior is normal. Thought content normal.          Assessment & Plan:

## 2012-08-05 NOTE — Assessment & Plan Note (Signed)
Rapid strep is negative.  Recommended tessalon prn cough, motrin prn fever, myalgia, drink plenty of fluids, call if symptoms worsen, if fever >101 or if not improved in 2-3 days.

## 2012-08-09 ENCOUNTER — Encounter: Payer: Self-pay | Admitting: Family

## 2012-08-09 ENCOUNTER — Ambulatory Visit (INDEPENDENT_AMBULATORY_CARE_PROVIDER_SITE_OTHER): Payer: BC Managed Care – PPO | Admitting: Family

## 2012-08-09 VITALS — BP 130/88 | HR 81 | Temp 98.6°F | Resp 16 | Wt 201.0 lb

## 2012-08-09 DIAGNOSIS — J209 Acute bronchitis, unspecified: Secondary | ICD-10-CM

## 2012-08-09 MED ORDER — ALBUTEROL SULFATE HFA 108 (90 BASE) MCG/ACT IN AERS: 2.0000 | INHALATION_SPRAY | Freq: Four times a day (QID) | RESPIRATORY_TRACT | Status: DC | PRN

## 2012-08-09 MED ORDER — PREDNISONE 10 MG PO TABS: ORAL_TABLET | ORAL | Status: DC

## 2012-08-09 MED ORDER — AZITHROMYCIN 250 MG PO TABS: ORAL_TABLET | ORAL | Status: DC

## 2012-08-09 NOTE — Progress Notes (Signed)
Subjective:    Patient ID: Dean Palmer, male    DOB: [birth year], 37 y.o.   MRN: [id number]  HPI  Mr. Hoyt is a 37  Yr old male who presents today with chief complaint of cough. Cough started about 5 days ago.  Cough is associated with sinus congestion, ear fullness.   Cough remains dry, feels like breathing is tight.  Reports that he had some trouble sleeping last night due to cough.  Took tessalon.  Coughed "so bad I actually threw my back out."  Feels like he is now developing a migraine. He was seen 4 days ago with viral symptoms but report that symptoms have continued to worsen.    Review of Systems See HPI  Past Medical History  Diagnosis Date  . GERD (gastroesophageal reflux disease)   . Bipolar 1 disorder   . Arthritis     DDD Lumbar  . Ulcer     associated with alcohol use  . Depression     followed by Tomasa Rand; followed every six months to nine months.    History   Social History  . Marital Status: Married    Spouse Name: N/A    Number of Children: N/A  . Years of Education: N/A   Occupational History  . apprentice//electrician [employer]   Social History Main Topics  . Smoking status: Former Smoker -- 0.25 packs/day    Types: Cigarettes    Start date: 03/20/2012  . Smokeless tobacco: Not on file  . Alcohol Use: No     Comment: none for 2 weeks   . Drug Use: No  . Sexually Active: Yes   Other Topics Concern  . Not on file   Social History Narrative   Marital status: married x 6 years; happily married.      Children: 1 son (70 yo)      Lives: with wife, son, mother-in-law.      Employment:  Personnel officer x 2000; moderately happy.      Tobacco:  Quit 03/2012; smoked x 15 years.  Quit 2004-2007.       Alcohol:  Rarely now; previous heavier use.      Drugs: none; previous marijuana years ago.       Exercise:  None      Seatbelt: 100%      Sunscreen: SPF 15      Guns: none    Past Surgical History  Procedure Laterality Date  .  Wisdom tooth extraction      Family History  Problem Relation Age of Onset  . Arthritis    . Lung cancer      MG aunt  . Diabetes Father   . Prostate cancer Paternal Grandfather     or possibly colon cancer  . Asthma Mother   . COPD Son     Allergies  Allergen Reactions  . Tramadol Nausea Only    Current Outpatient Prescriptions on File Prior to Visit  Medication Sig Dispense Refill  . benzonatate (TESSALON) 100 MG capsule Take 1 capsule (100 mg total) by mouth 3 (three) times daily as needed for cough.  20 capsule  0  . divalproex (DEPAKOTE) 500 MG EC tablet Take 500 mg by mouth. 2 tab every night       . LamoTRIgine (LAMICTAL XR) 100 MG TB24 Take by mouth at bedtime.        . naproxen (NAPROSYN) 500 MG tablet Take 1 tablet (500 mg total) by mouth 2 (two) times  daily with a meal.  60 tablet  0   No current facility-administered medications on file prior to visit.    BP 130/88  Pulse 81  Temp(Src) 98.6 F (37 C) (Oral)  Resp 16  Wt 201 lb (91.173 kg)  BMI 24.86 kg/m2  SpO2 99%       Objective:   Physical Exam  Constitutional: He is oriented to person, place, and time. He appears well-developed and well-nourished. No distress.  HENT:  Head: Normocephalic and atraumatic.  Right Ear: Tympanic membrane and ear canal normal.  Left Ear: Tympanic membrane and ear canal normal.  Mouth/Throat: Posterior oropharyngeal erythema present. No oropharyngeal exudate or posterior oropharyngeal edema.  Cardiovascular: Normal rate and regular rhythm.   No murmur heard. Pulmonary/Chest: Effort normal and breath sounds normal. No respiratory distress. He has no wheezes. He has no rales. He exhibits no tenderness.  Neurological: He is alert and oriented to person, place, and time.  Psychiatric: He has a normal mood and affect. His behavior is normal. Judgment and thought content normal.          Assessment & Plan:

## 2012-08-09 NOTE — Assessment & Plan Note (Signed)
Will rx with zithromax, pred taper and prn albuterol.  I suspect some associated bronchspasm.  If symptoms do not improve with this regimen we discussed doing a CXR.  He is requesting med for back pain.  I told pt that I am hopefull that the prednisone will also help his back pain.  He can also use naproxen sparingly.

## 2012-08-09 NOTE — Patient Instructions (Addendum)
Please call if symptoms worsen or if not improved in 2-3 days.   

## 2012-08-31 ENCOUNTER — Telehealth: Payer: Self-pay | Admitting: *Deleted

## 2012-08-31 NOTE — Telephone Encounter (Signed)
Received call from pt requesting appointment for evaluation of slurred speech since yesterday afternoon, feels he is having difficulty staying awake. Reports that his co-workers are asking why he is acting different. States that he was having blurred vision yesterday but seems to be normal today. Does note difficulty holding screwdriver with his left hand. Advised pt that he should be evaluated in the ER  ASAP. States that he does not have anyone that can drive him as he is currently working in [place]. Advised pt to seek emergent care where he is or pull over and call EMS to transport him to nearest ER. Pt voices understanding.

## 2012-08-31 NOTE — Telephone Encounter (Signed)
Noted, agree with plan.

## 2012-11-17 ENCOUNTER — Emergency Department (INDEPENDENT_AMBULATORY_CARE_PROVIDER_SITE_OTHER)
Admission: EM | Admit: 2012-11-17 | Discharge: 2012-11-17 | Disposition: A | Discharge status: Home / Self Care | Payer: Self-pay | Source: Home / Self Care

## 2012-11-17 ENCOUNTER — Encounter (HOSPITAL_COMMUNITY): Payer: Self-pay | Admitting: Emergency Medicine

## 2012-11-17 DIAGNOSIS — M549 Dorsalgia, unspecified: Secondary | ICD-10-CM

## 2012-11-17 DIAGNOSIS — G8929 Other chronic pain: Secondary | ICD-10-CM

## 2012-11-17 MED ORDER — NAPROXEN 500 MG PO TABS: 500.0000 mg | ORAL_TABLET | Freq: Two times a day (BID) | ORAL | Status: DC

## 2012-11-17 MED ORDER — HYDROCODONE-ACETAMINOPHEN 10-325 MG PO TABS: 1.0000 | ORAL_TABLET | Freq: Four times a day (QID) | ORAL | Status: DC | PRN

## 2012-11-17 MED ORDER — HYDROCODONE-ACETAMINOPHEN 5-325 MG PO TABS
ORAL_TABLET | ORAL | Status: AC
  Filled 2012-11-17: qty 2

## 2012-11-17 MED ORDER — KETOROLAC TROMETHAMINE 60 MG/2ML IM SOLN
INTRAMUSCULAR | Status: AC
  Filled 2012-11-17: qty 2

## 2012-11-17 MED ORDER — HYDROCODONE-ACETAMINOPHEN 5-325 MG PO TABS
2.0000 | ORAL_TABLET | Freq: Once | ORAL | Status: AC
  Administered 2012-11-17: 2 via ORAL

## 2012-11-17 MED ORDER — KETOROLAC TROMETHAMINE 60 MG/2ML IM SOLN
60.0000 mg | Freq: Once | INTRAMUSCULAR | Status: AC
  Administered 2012-11-17: 60 mg via INTRAMUSCULAR

## 2012-11-17 NOTE — ED Provider Notes (Signed)
CSN: [id number]     Arrival date & time 11/17/12  1008 History   First MD Initiated Contact with Patient 11/17/12 1035     Chief Complaint  Patient presents with  . Back Pain   (Consider location/radiation/quality/duration/timing/severity/associated sxs/prior Treatment) HPI Comments: 37 year old male presents complaining of lower back pain that began Tuesday. He has a history of chronic lower back pain with multiple exacerbations. On Tuesday, he twisted and felt his back "go out." Since then, he has had constant pain in the lower back that radiates to his left hip and down his left leg. In addition to this, he complains of daily pain in both of his hips that is worse in the morning and resolves with activity especially throughout the day. He does not report any specific injury that has exacerbated his pain. He denies fever, chills, loss of bowel or bladder control, perineal numbness. He was set up to see orthopedics but lost his job and his health insurance so he has not been to see an orthopedic physician.  Patient is a 37 y.o. male presenting with back pain.  Back Pain Associated symptoms: numbness   Associated symptoms: no abdominal pain, no chest pain, no dysuria, no fever and no weakness     Past Medical History  Diagnosis Date  . GERD (gastroesophageal reflux disease)   . Bipolar 1 disorder   . Arthritis     DDD Lumbar  . Ulcer     associated with alcohol use  . Depression     followed by Tomasa Rand; followed every six months to nine months.   Past Surgical History  Procedure Laterality Date  . Wisdom tooth extraction     Family History  Problem Relation Age of Onset  . Arthritis    . Lung cancer      MG aunt  . Diabetes Father   . Prostate cancer Paternal Grandfather     or possibly colon cancer  . Asthma Mother   . COPD Son    History  Substance Use Topics  . Smoking status: Former Smoker -- 0.25 packs/day    Types: Cigarettes    Start date: 03/20/2012  .  Smokeless tobacco: Not on file  . Alcohol Use: No     Comment: none for 2 weeks     Review of Systems  Constitutional: Negative for fever, chills and fatigue.  HENT: Negative for sore throat, neck pain and neck stiffness.   Eyes: Negative for visual disturbance.  Respiratory: Negative for cough and shortness of breath.   Cardiovascular: Negative for chest pain, palpitations and leg swelling.  Gastrointestinal: Negative for nausea, vomiting, abdominal pain, diarrhea and constipation.  Genitourinary: Negative for dysuria, urgency, frequency and hematuria.  Musculoskeletal: Positive for back pain. Negative for myalgias and arthralgias.  Skin: Negative for rash.  Neurological: Positive for numbness. Negative for dizziness, weakness and light-headedness.    Allergies  Tramadol  Home Medications   Current Outpatient Rx  Name  Route  Sig  Dispense  Refill  . divalproex (DEPAKOTE) 500 MG EC tablet   Oral   Take 500 mg by mouth. 2 tab every night          . LamoTRIgine (LAMICTAL XR) 100 MG TB24   Oral   Take by mouth at bedtime.           Marland Kitchen albuterol (PROVENTIL HFA;VENTOLIN HFA) 108 (90 BASE) MCG/ACT inhaler   Inhalation   Inhale 2 puffs into the lungs every 6 (six) hours as  needed for wheezing.   1 Inhaler   0   . azithromycin (ZITHROMAX) 250 MG tablet      2 tabs by mouth today, then one tablet by mouth daily x 4 more days   6 tablet   0   . benzonatate (TESSALON) 100 MG capsule   Oral   Take 1 capsule (100 mg total) by mouth 3 (three) times daily as needed for cough.   20 capsule   0   . HYDROcodone-acetaminophen (NORCO) 10-325 MG per tablet   Oral   Take 1 tablet by mouth every 6 (six) hours as needed for pain.   15 tablet   0   . naproxen (NAPROSYN) 500 MG tablet   Oral   Take 1 tablet (500 mg total) by mouth 2 (two) times daily with a meal.   60 tablet   0   . predniSONE (DELTASONE) 10 MG tablet      4 tabs by mouth once daily x 2 days, then 3 tabs  daily x 2 days, then 2 tabs daily x 2 days, then 1 tab daily x 2 days   20 tablet   0    BP 115/70  Pulse 73  Temp(Src) 98.2 F (36.8 C) (Oral)  Resp 18  SpO2 100% Physical Exam  Nursing note and vitals reviewed. Constitutional: He is oriented to person, place, and time. He appears well-developed and well-nourished. No distress.  HENT:  Head: Normocephalic and atraumatic.  Pulmonary/Chest: Effort normal.  Abdominal: Soft. There is no tenderness. There is no rebound and no guarding.  Musculoskeletal: Normal range of motion. He exhibits tenderness (Spinous processes in the lumbar spine).  Neurological: He is alert and oriented to person, place, and time. Coordination normal.  Skin: Skin is warm. No rash noted. He is not diaphoretic.  Psychiatric: He has a normal mood and affect. Judgment normal.    ED Course  Procedures (including critical care time) Labs Review Labs Reviewed - No data to display Imaging Review No results found.  MDM   1. Back pain, chronic    This patient probably needs to have an MRI done to evaluate the cause of his back pain. In the meantime, we will treat symptomatically. He is instructed to followup with orthopedics as soon as he is able to work out his health insurance plan    Meds ordered this encounter  Medications  . naproxen (NAPROSYN) 500 MG tablet    Sig: Take 1 tablet (500 mg total) by mouth 2 (two) times daily with a meal.    Dispense:  60 tablet    Refill:  0  . HYDROcodone-acetaminophen (NORCO) 10-325 MG per tablet    Sig: Take 1 tablet by mouth every 6 (six) hours as needed for pain.    Dispense:  15 tablet    Refill:  0  . ketorolac (TORADOL) injection 60 mg    Sig:   . HYDROcodone-acetaminophen (NORCO/VICODIN) 5-325 MG per tablet 2 tablet    Sig:      Graylon Good, PA-C 11/17/12 1134

## 2012-11-17 NOTE — ED Notes (Signed)
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

## 2012-11-19 NOTE — ED Provider Notes (Signed)
Medical screening examination/treatment/procedure(s) were performed by a resident physician or non-physician practitioner and as the supervising physician I was immediately available for consultation/collaboration.  Evan Corey, MD   Evan S Corey, MD 11/19/12 0802 

## 2013-07-21 ENCOUNTER — Ambulatory Visit (INDEPENDENT_AMBULATORY_CARE_PROVIDER_SITE_OTHER): Payer: 59 | Admitting: Physician Assistant

## 2013-07-21 ENCOUNTER — Encounter: Payer: Self-pay | Admitting: Physician Assistant

## 2013-07-21 VITALS — BP 122/70 | HR 68 | Temp 98.7°F | Ht 75.0 in | Wt 193.1 lb

## 2013-07-21 DIAGNOSIS — R109 Unspecified abdominal pain: Secondary | ICD-10-CM | POA: Insufficient documentation

## 2013-07-21 LAB — POCT URINALYSIS DIPSTICK
BILIRUBIN UA: NEGATIVE
Blood, UA: NEGATIVE
Glucose, UA: NEGATIVE
KETONES UA: NEGATIVE
Leukocytes, UA: NEGATIVE
Nitrite, UA: NEGATIVE
PH UA: 6
PROTEIN UA: NEGATIVE
SPEC GRAV UA: 1.02
Urobilinogen, UA: 0.2

## 2013-07-21 LAB — CBC WITH DIFFERENTIAL/PLATELET
BASOS ABS: 0.1 10*3/uL (ref 0.0–0.1)
BASOS PCT: 1 % (ref 0–1)
EOS ABS: 0.3 10*3/uL (ref 0.0–0.7)
Eosinophils Relative: 3 % (ref 0–5)
HCT: 39.7 % (ref 39.0–52.0)
Hemoglobin: 14 g/dL (ref 13.0–17.0)
Lymphocytes Relative: 32 % (ref 12–46)
Lymphs Abs: 2.7 10*3/uL (ref 0.7–4.0)
MCH: 29.8 pg (ref 26.0–34.0)
MCHC: 35.3 g/dL (ref 30.0–36.0)
MCV: 84.5 fL (ref 78.0–100.0)
Monocytes Absolute: 0.8 10*3/uL (ref 0.1–1.0)
Monocytes Relative: 9 % (ref 3–12)
NEUTROS PCT: 55 % (ref 43–77)
Neutro Abs: 4.7 10*3/uL (ref 1.7–7.7)
PLATELETS: 240 10*3/uL (ref 150–400)
RBC: 4.7 MIL/uL (ref 4.22–5.81)
RDW: 14.1 % (ref 11.5–15.5)
WBC: 8.5 10*3/uL (ref 4.0–10.5)

## 2013-07-21 LAB — AMYLASE: Amylase: 28 U/L (ref 0–105)

## 2013-07-21 LAB — LIPASE: LIPASE: 21 U/L (ref 0–75)

## 2013-07-21 MED ORDER — HYDROCODONE-ACETAMINOPHEN 5-325 MG PO TABS: 1.0000 | ORAL_TABLET | Freq: Three times a day (TID) | ORAL | Status: DC | PRN

## 2013-07-21 MED ORDER — ONDANSETRON 8 MG PO TBDP: 8.0000 mg | ORAL_TABLET | Freq: Three times a day (TID) | ORAL | Status: DC | PRN

## 2013-07-21 NOTE — Progress Notes (Signed)
Pre visit review using our clinic review tool, if applicable. No additional management support is needed unless otherwise documented below in the visit note. 

## 2013-07-21 NOTE — Patient Instructions (Signed)
Please obtain labs.  I will call you with your results.  Stop by front desk for instructions for your CT scan.  Use Norco for pain and Zofran for nausea. I will call you with your imaging results.  Avoid antiinflammatories like Ibuprofen or BC/Goody powders.  Avoid alcohol and cigarettes.  If pain acutely worsens or you develop fever and chills, please proceed to the ER.

## 2013-07-22 ENCOUNTER — Ambulatory Visit (HOSPITAL_BASED_OUTPATIENT_CLINIC_OR_DEPARTMENT_OTHER)
Admission: RE | Admit: 2013-07-22 | Discharge: 2013-07-22 | Disposition: A | Discharge status: Home / Self Care | Payer: 59 | Source: Ambulatory Visit | Attending: Physician Assistant | Admitting: Physician Assistant

## 2013-07-22 DIAGNOSIS — R109 Unspecified abdominal pain: Secondary | ICD-10-CM | POA: Insufficient documentation

## 2013-07-22 DIAGNOSIS — Q762 Congenital spondylolisthesis: Secondary | ICD-10-CM | POA: Insufficient documentation

## 2013-07-22 MED ORDER — IOHEXOL 300 MG/ML  SOLN
100.0000 mL | Freq: Once | INTRAMUSCULAR | Status: AC | PRN
  Administered 2013-07-22: 100 mL via INTRAVENOUS

## 2013-07-23 ENCOUNTER — Telehealth: Payer: Self-pay | Admitting: Physician Assistant

## 2013-07-23 DIAGNOSIS — G8929 Other chronic pain: Secondary | ICD-10-CM

## 2013-07-23 DIAGNOSIS — R109 Unspecified abdominal pain: Principal | ICD-10-CM

## 2013-07-23 NOTE — Telephone Encounter (Signed)
CT scan and labs are unremarkable for cause of patient's symptoms. H. Pylori results are pending.  Continue care as discussed at visit.  Will refer him to GI specialist.  He will be contacted by their office for appointment date/time for further workup.

## 2013-07-23 NOTE — Progress Notes (Signed)
Patient presents to clinic today c/o diffuse abdominal pain x6 months. Pain has become more frequent in the past 3 months, and more severe for the past few days. Patient endorses having a hard time localizing pain as he states it travels. Pain currently is focused in bilateral lower quadrants. Pain is described as usually being crampy and burning in nature. Patient endorses sharp pain of lower abdomen over the past couple of days. Patient denies nausea or vomiting. Denies constipation or loose stool. Denies tenesmus, melena or hematochezia. Patient is a smoker and does occasionally drink alcohol. Patient denies NSAID use. Denies history of stomach ulcer. Denies heartburn, globus or chronic cough.  Past Medical History  Diagnosis Date  . GERD (gastroesophageal reflux disease)   . Bipolar 1 disorder   . Arthritis     DDD Lumbar  . Ulcer     associated with alcohol use  . Depression     followed by Candis Schatz; followed every six months to nine months.    Current Outpatient Prescriptions on File Prior to Visit  Medication Sig Dispense Refill  . LamoTRIgine (LAMICTAL XR) 100 MG TB24 Take by mouth at bedtime.         No current facility-administered medications on file prior to visit.    Allergies  Allergen Reactions  . Tramadol Nausea Only    Family History  Problem Relation Age of Onset  . Arthritis    . Lung cancer      MG aunt  . Diabetes Father   . Prostate cancer Paternal Grandfather     or possibly colon cancer  . Asthma Mother   . COPD Son     History   Social History  . Marital Status: Married    Spouse Name: N/A    Number of Children: N/A  . Years of Education: N/A   Occupational History  . apprentice//electrician [employer]   Social History Main Topics  . Smoking status: Former Smoker -- 0.25 packs/day    Types: Cigarettes    Start date: 03/20/2012  . Smokeless tobacco: None  . Alcohol Use: No     Comment: none for 2 weeks   . Drug Use: No   . Sexual Activity: Yes   Other Topics Concern  . None   Social History Narrative   Marital status: married x 6 years; happily married.      Children: 1 son (66 yo)      Lives: with wife, son, mother-in-law.      Employment:  Clinical biochemist x 2000; moderately happy.      Tobacco:  Quit 03/2012; smoked x 15 years.  Quit 2004-2007.       Alcohol:  Rarely now; previous heavier use.      Drugs: none; previous marijuana years ago.       Exercise:  None      Seatbelt: 100%      Sunscreen: SPF 15      Guns: none   Review of Systems - See HPI.  All other ROS are negative.  BP 122/70  Pulse 68  Temp(Src) 98.7 F (37.1 C) (Oral)  Ht 6' 3"  (1.905 m)  Wt 193 lb 1.3 oz (87.581 kg)  BMI 24.13 kg/m2  SpO2 99%  Physical Exam  Constitutional: He is oriented to person, place, and time and well-developed, well-nourished, and in no distress.  HENT:  Head: Normocephalic and atraumatic.  Eyes: Conjunctivae are normal. Pupils are equal, round, and reactive to light.  Neck: Neck  supple.  Cardiovascular: Normal rate, regular rhythm, normal heart sounds and intact distal pulses.   Pulmonary/Chest: Effort normal and breath sounds normal. No respiratory distress. He has no wheezes. He has no rales. He exhibits no tenderness.  Abdominal: Soft. Bowel sounds are normal. He exhibits no distension and no mass. There is tenderness. There is rebound. There is no guarding.  Questionable rebound tenderness. Positive obturator sign. Negative iliopsoas sign. Questionable McBurney's point tenderness.  Lymphadenopathy:    He has no cervical adenopathy.  Neurological: He is alert and oriented to person, place, and time. No cranial nerve deficit.  Skin: Skin is warm and dry. No rash noted.  Psychiatric: Affect normal.    Recent Results (from the past 2160 hour(s))  POCT URINALYSIS DIPSTICK     Status: None   Collection Time    07/21/13  4:33 PM      Result Value Ref Range   Color, UA yellow     Clarity, UA  clear     Glucose, UA neg     Bilirubin, UA neg     Ketones, UA neg     Spec Grav, UA 1.020     Blood, UA neg     pH, UA 6.0     Protein, UA neg     Urobilinogen, UA 0.2     Nitrite, UA neg     Leukocytes, UA Negative    CBC WITH DIFFERENTIAL     Status: None   Collection Time    07/21/13  4:54 PM      Result Value Ref Range   WBC 8.5  4.0 - 10.5 K/uL   RBC 4.70  4.22 - 5.81 MIL/uL   Hemoglobin 14.0  13.0 - 17.0 g/dL   HCT 39.7  39.0 - 52.0 %   MCV 84.5  78.0 - 100.0 fL   MCH 29.8  26.0 - 34.0 pg   MCHC 35.3  30.0 - 36.0 g/dL   RDW 14.1  11.5 - 15.5 %   Platelets 240  150 - 400 K/uL   Neutrophils Relative % 55  43 - 77 %   Neutro Abs 4.7  1.7 - 7.7 K/uL   Lymphocytes Relative 32  12 - 46 %   Lymphs Abs 2.7  0.7 - 4.0 K/uL   Monocytes Relative 9  3 - 12 %   Monocytes Absolute 0.8  0.1 - 1.0 K/uL   Eosinophils Relative 3  0 - 5 %   Eosinophils Absolute 0.3  0.0 - 0.7 K/uL   Basophils Relative 1  0 - 1 %   Basophils Absolute 0.1  0.0 - 0.1 K/uL   Smear Review Criteria for review not met    LIPASE     Status: None   Collection Time    07/21/13  4:54 PM      Result Value Ref Range   Lipase 21  0 - 75 U/L  AMYLASE     Status: None   Collection Time    07/21/13  4:54 PM      Result Value Ref Range   Amylase 28  0 - 105 U/L  H. PYLORI ANTIBODY, IGG     Status: None   Collection Time    07/21/13  4:54 PM      Result Value Ref Range   H Pylori IgG        Assessment/Plan: Abdominal pain Will obtain labs to include CBC, CMP, lipase, amylase, H. Pylori and UA. Will obtain  CT abdomen and pelvis to rule out appendicitis due to questionable exam and severity of pain with exam. Rx Norco. Avoid NSAIDs. If workup unremarkable, will refer to GI for further evaluation and possible endoscopy.

## 2013-07-23 NOTE — Assessment & Plan Note (Signed)
Will obtain labs to include CBC, CMP, lipase, amylase, H. Pylori and UA. Will obtain CT abdomen and pelvis to rule out appendicitis due to questionable exam and severity of pain with exam. Rx Norco. Avoid NSAIDs. If workup unremarkable, will refer to GI for further evaluation and possible endoscopy.

## 2013-07-24 ENCOUNTER — Ambulatory Visit: Payer: Self-pay | Admitting: Family

## 2013-07-24 LAB — H. PYLORI ANTIBODY, IGG: H Pylori IgG: <0.4 {ISR}

## 2013-08-11 ENCOUNTER — Encounter: Payer: Self-pay | Admitting: Internal Medicine

## 2013-10-12 ENCOUNTER — Other Ambulatory Visit (INDEPENDENT_AMBULATORY_CARE_PROVIDER_SITE_OTHER): Payer: 59

## 2013-10-12 ENCOUNTER — Ambulatory Visit (INDEPENDENT_AMBULATORY_CARE_PROVIDER_SITE_OTHER): Payer: 59 | Admitting: Internal Medicine

## 2013-10-12 ENCOUNTER — Encounter: Payer: Self-pay | Admitting: Internal Medicine

## 2013-10-12 VITALS — BP 108/50 | HR 80 | Ht 73.0 in | Wt 178.5 lb

## 2013-10-12 DIAGNOSIS — R1030 Lower abdominal pain, unspecified: Secondary | ICD-10-CM

## 2013-10-12 DIAGNOSIS — R110 Nausea: Secondary | ICD-10-CM

## 2013-10-12 DIAGNOSIS — R109 Unspecified abdominal pain: Secondary | ICD-10-CM

## 2013-10-12 DIAGNOSIS — R112 Nausea with vomiting, unspecified: Secondary | ICD-10-CM

## 2013-10-12 DIAGNOSIS — R198 Other specified symptoms and signs involving the digestive system and abdomen: Secondary | ICD-10-CM

## 2013-10-12 DIAGNOSIS — K920 Hematemesis: Secondary | ICD-10-CM

## 2013-10-12 DIAGNOSIS — K219 Gastro-esophageal reflux disease without esophagitis: Secondary | ICD-10-CM

## 2013-10-12 LAB — COMPREHENSIVE METABOLIC PANEL
ALT: 18 U/L (ref 0–53)
AST: 19 U/L (ref 0–37)
Albumin: 4.1 g/dL (ref 3.5–5.2)
Alkaline Phosphatase: 42 U/L (ref 39–117)
BILIRUBIN TOTAL: 1 mg/dL (ref 0.2–1.2)
BUN: 9 mg/dL (ref 6–23)
CALCIUM: 9.1 mg/dL (ref 8.4–10.5)
CHLORIDE: 106 meq/L (ref 96–112)
CO2: 29 meq/L (ref 19–32)
CREATININE: 0.8 mg/dL (ref 0.4–1.5)
GFR: 114.53 mL/min (ref >60.00)
GLUCOSE: 82 mg/dL (ref 70–99)
Potassium: 4 mEq/L (ref 3.5–5.1)
SODIUM: 139 meq/L (ref 135–145)
TOTAL PROTEIN: 7.2 g/dL (ref 6.0–8.3)

## 2013-10-12 LAB — IGA: IgA: 224 mg/dL (ref 68–378)

## 2013-10-12 LAB — TSH: TSH: 1.08 u[IU]/mL (ref 0.35–4.50)

## 2013-10-12 MED ORDER — PEG-KCL-NACL-NASULF-NA ASC-C 100 G PO SOLR: 1.0000 | Freq: Once | ORAL | Status: DC

## 2013-10-12 MED ORDER — PANTOPRAZOLE SODIUM 40 MG PO TBEC: 40.0000 mg | DELAYED_RELEASE_TABLET | Freq: Every day | ORAL | Status: DC

## 2013-10-12 MED ORDER — DICYCLOMINE HCL 20 MG PO TABS: 20.0000 mg | ORAL_TABLET | Freq: Three times a day (TID) | ORAL | Status: DC | PRN

## 2013-10-12 NOTE — Progress Notes (Signed)
Patient ID: Dean Palmer, male   DOB: [birth year], 38 y.o.   MRN: [id number] HPI: Dean Palmer is a 38 yo male with PMH of GERD, lumbar disc disease, alcohol abuse, bipolar disorder who is seen in consultation at the request of Elyn Aquas, PA-C to evaluate lower abdominal pain. The patient is here alone today. He reports bilateral lower abdominal pain, located below the umbilicus but above the pubic symphysis. He describes this pain as dull and aching and at times "fiery and stabbing". This is not always related to eating. He reports he can experience constipation which alternates with loose stool. He denies blood in his stool or melena. He reports that most of the symptoms started around November 2014 when he developed marital trouble with his wife. He has gone through a separation and is now back with his wife trying to make his relationship work. They have a six-year-old son. He reports a history of alcohol abuse and describes himself as a "alcoholic". He said he stopped drinking approximately 3 months ago after weaning himself off. At one point in the early 2000s he says he was drinking a pint of liquor a day. Separate from his lower abdominal pain he reports occasional nausea with vomiting, worse in the morning. He says occasionally he has coffee ground emesis associated with his vomiting. He does report hiccups which are new for him and occurring more frequently as well as heartburn. He denies dysphagia or odynophagia. He reports a 30 pound weight loss in the last 8 months. He thinks this is related to alcohol cessation and improvement in diet. He works as an Clinical biochemist. Family history notable for paternal grandfather with prostate cancer, father with colonic polyps, he reports his mother's family has a history of COPD. Denies family history of colon cancer or IBD  Past Medical History  Diagnosis Date  . GERD (gastroesophageal reflux disease)   . Bipolar 1 disorder   . Arthritis     DDD Lumbar  .  Ulcer     associated with alcohol use  . Depression     followed by Candis Schatz; followed every six months to nine months.    Past Surgical History  Procedure Laterality Date  . Wisdom tooth extraction      Outpatient Prescriptions Prior to Visit  Medication Sig Dispense Refill  . HYDROcodone-acetaminophen (NORCO/VICODIN) 5-325 MG per tablet Take 1 tablet by mouth every 8 (eight) hours as needed for moderate pain.  40 tablet  0  . LamoTRIgine (LAMICTAL XR) 100 MG TB24 Take by mouth at bedtime.        . ondansetron (ZOFRAN-ODT) 8 MG disintegrating tablet Take 1 tablet (8 mg total) by mouth every 8 (eight) hours as needed for nausea.  20 tablet  3   No facility-administered medications prior to visit.    Allergies  Allergen Reactions  . Tramadol Nausea Only    Family History  Problem Relation Age of Onset  . Arthritis    . Lung cancer      MG aunt  . Diabetes Father   . Prostate cancer Paternal Grandfather     or possibly colon cancer  . Asthma Mother   . COPD Son     History  Substance Use Topics  . Smoking status: Former Smoker -- 0.25 packs/day    Types: Cigarettes    Start date: 03/20/2012  . Smokeless tobacco: Not on file  . Alcohol Use: No     Comment: none for 2 weeks  ROS: As per history of present illness, otherwise negative  Ht 6\' 1"  (1.854 m)  Wt 178 lb 8 oz (80.967 kg)  BMI 23.56 kg/m2 Constitutional: Well-developed and well-nourished. No distress. HEENT: Normocephalic and atraumatic. Oropharynx is clear and moist. No oropharyngeal exudate. Conjunctivae are normal.  No scleral icterus. Poor dentition Neck: Neck supple. Trachea midline. Cardiovascular: Normal rate, regular rhythm and intact distal pulses. No M/R/G Pulmonary/chest: Effort normal and breath sounds normal. No wheezing, rales or rhonchi. Abdominal: Soft, mild lower abdominal tenderness without rebound or guarding, nondistended. Bowel sounds active throughout. There are no masses  palpable.  Extremities: no clubbing, cyanosis, or edema Lymphadenopathy: No cervical adenopathy noted. Neurological: Alert and oriented to person place and time. Skin: Skin is warm and dry. No rashes noted. Psychiatric: Normal mood and affect. Behavior is normal.  RELEVANT LABS AND IMAGING: CBC    Component Value Date/Time   WBC 8.5 07/21/2013 1654   RBC 4.70 07/21/2013 1654   HGB 14.0 07/21/2013 1654   HCT 39.7 07/21/2013 1654   PLT 240 07/21/2013 1654   MCV 84.5 07/21/2013 1654   MCH 29.8 07/21/2013 1654   MCHC 35.3 07/21/2013 1654   RDW 14.1 07/21/2013 1654   LYMPHSABS 2.7 07/21/2013 1654   MONOABS 0.8 07/21/2013 1654   EOSABS 0.3 07/21/2013 1654   BASOSABS 0.1 07/21/2013 1654    CMP     Component Value Date/Time   NA 139 05/31/2009 2105   K 4.3 05/31/2009 2105   CL 103 05/31/2009 2105   CO2 23 05/31/2009 2105   GLUCOSE 81 05/31/2009 2105   BUN 14 05/31/2009 2105   CREATININE 0.88 05/31/2009 2105   CALCIUM 9.4 05/31/2009 2105   PROT 7.4 05/31/2009 2105   ALBUMIN 4.6 05/31/2009 2105   AST 19 05/31/2009 2105   ALT 14 05/31/2009 2105   ALKPHOS 46 05/31/2009 2105   BILITOT 0.8 05/31/2009 2105   Lab Results  Component Value Date   LIPASE 21 07/21/2013   CT ABDOMEN AND PELVIS WITH CONTRAST   TECHNIQUE: Multidetector CT imaging of the abdomen and pelvis was performed using the standard protocol following bolus administration of intravenous contrast.   CONTRAST:  137mL OMNIPAQUE IOHEXOL 300 MG/ML  SOLN   COMPARISON:  None.   FINDINGS: The lung bases are clear. No pleural or pericardial effusion identified.   No focal liver abnormality. The gallbladder appears normal. No biliary dilatation. Normal appearance of the pancreas. The spleen is unremarkable.   The adrenal glands are both normal. Normal appearance of the kidneys. The urinary bladder appears normal. The prostate gland and seminal vesicles are on unremarkable.   Normal caliber of the abdominal aorta. There is no aneurysm. No upper  abdominal adenopathy identified. There is no pelvic or inguinal adenopathy identified. The stomach appears normal. The small bowel loops have a normal course and caliber and there is no evidence for obstruction. The appendix is visualized, image 62/series 2. There is no evidence for acute appendicitis.   Normal appearance of the colon review of the visualized bony structures is significant for bilateral L5 pars defects. There is a first degree anterolisthesis of L5 on S1.   IMPRESSION: 1. No acute findings identified within the abdomen or pelvis. 2. To the appendix is visualized and is normal. 3. Bilateral L5 pars defects.     Electronically Signed   By: Kerby Moors M.D.   On: 07/22/2013 14:37   ASSESSMENT/PLAN: 38 yo male with PMH of GERD, lumbar disc disease, alcohol abuse,  bipolar disorder who is seen in consultation at the request of Elyn Aquas, PA-C to evaluate lower abdominal pain.   1. Lower abd pain/alternating diarrhea and constipation -- his symptoms sound mostly irritable bowel in nature. He reports his symptoms certainly worsen with stress. What is not not consistent with irritable bowel is weight loss. I have recommended colonoscopy to evaluate the colon and rule out evidence of inflammatory bowel disease. We discussed the test including the risks and benefits and he is agreeable to proceed. Labs today to include CMP, celiac panel, and TSH. I have prescribed Bentyl 20 mg 3 times daily as needed for lower, cramping pain. His abdominal CT scan and prior labs were reviewed all of which were reassuring. CMP was not done and will be performed today as stated.  2.  GERD/coffee-ground emesis/nausea -- he would previously have been arrest for alcohol gastritis, but he does state he is no longer drinking alcohol. Recommended EGD given the coffee-ground emesis and after discussion of risks and benefits he is agreeable to proceed. I will start pantoprazole 40 mg once daily. He is  asked to take this 30 minutes before breakfast daily.  3. Alcohol abuse -- he states this is in remission. I have encouraged him to maintain alcohol abstinence given his history of alcohol abuse. Liver panel today

## 2013-10-12 NOTE — Patient Instructions (Addendum)
You have been given a separate informational sheet regarding your tobacco use, the importance of quitting and local resources to help you quit.  Your physician has requested that you go to the basement for the following lab work before leaving today: TSH, CMP, IGA, TTG.  We have sent the following medications to your pharmacy for you to pick up at your convenience:Bentyl and pantoprazole.  You have been scheduled for an endoscopy and colonoscopy. Please follow the written instructions given to you at your visit today. Please pick up your prep at the pharmacy within the next 1-3 days. If you use inhalers (even only as needed), please bring them with you on the day of your procedure. Your physician has requested that you go to www.startemmi.com and enter the access code given to you at your visit today. This web site gives a general overview about your procedure. However, you should still follow specific instructions given to you by our office regarding your preparation for the procedure.  cc: Leeanne Rio, PA-C

## 2013-10-13 LAB — TISSUE TRANSGLUTAMINASE, IGA: TISSUE TRANSGLUTAMINASE AB, IGA: 6 U/mL (ref <20)

## 2013-11-01 ENCOUNTER — Encounter: Payer: Self-pay | Admitting: Internal Medicine

## 2013-11-13 ENCOUNTER — Ambulatory Visit (AMBULATORY_SURGERY_CENTER): Payer: 59 | Admitting: Internal Medicine

## 2013-11-13 ENCOUNTER — Encounter: Payer: Self-pay | Admitting: Internal Medicine

## 2013-11-13 VITALS — BP 112/74 | HR 62 | Temp 99.4°F | Resp 11 | Ht 73.0 in | Wt 178.0 lb

## 2013-11-13 DIAGNOSIS — K920 Hematemesis: Secondary | ICD-10-CM

## 2013-11-13 DIAGNOSIS — R1030 Lower abdominal pain, unspecified: Secondary | ICD-10-CM

## 2013-11-13 DIAGNOSIS — K2970 Gastritis, unspecified, without bleeding: Secondary | ICD-10-CM

## 2013-11-13 DIAGNOSIS — R110 Nausea: Secondary | ICD-10-CM

## 2013-11-13 DIAGNOSIS — R109 Unspecified abdominal pain: Secondary | ICD-10-CM

## 2013-11-13 DIAGNOSIS — R198 Other specified symptoms and signs involving the digestive system and abdomen: Secondary | ICD-10-CM

## 2013-11-13 DIAGNOSIS — D126 Benign neoplasm of colon, unspecified: Secondary | ICD-10-CM

## 2013-11-13 DIAGNOSIS — R634 Abnormal weight loss: Secondary | ICD-10-CM

## 2013-11-13 DIAGNOSIS — K2990 Gastroduodenitis, unspecified, without bleeding: Secondary | ICD-10-CM

## 2013-11-13 MED ORDER — SODIUM CHLORIDE 0.9 % IV SOLN: 500.0000 mL | INTRAVENOUS | Status: DC

## 2013-11-13 NOTE — Op Note (Signed)
[place]  [address] [place], [id number]   COLONOSCOPY PROCEDURE REPORT  PATIENT: Dean Palmer, Dean Palmer  MR#: [id number] BIRTHDATE: 11-11-1975 , 37  yrs. old GENDER: Male ENDOSCOPIST: Jerene Bears, MD REFERRED BY: Elyn Aquas, PA-C PROCEDURE DATE:  11/13/2013 PROCEDURE:   Colonoscopy with snare polypectomy First Screening Colonoscopy - Avg.  risk and is 50 yrs.  old or older - No.  Prior Negative Screening - Now for repeat screening. N/A  History of Adenoma - Now for follow-up colonoscopy & has been > or = to 3 yrs.  N/A  Polyps Removed Today? Yes. ASA CLASS:   Class II INDICATIONS:Lower abdominal pain, alternating diarrhea with constipation, weight loss. MEDICATIONS: MAC sedation, administered by CRNA and propofol (Diprivan) 250mg  IV  DESCRIPTION OF PROCEDURE:   After the risks benefits and alternatives of the procedure were thoroughly explained, informed consent was obtained.  A digital rectal exam revealed no rectal mass.   The LB [id number]  endoscope was introduced through the anus and advanced to the cecum, which was identified by both the appendix and ileocecal valve. No adverse events experienced. The quality of the prep was good, using MoviPrep  The instrument was then slowly withdrawn as the colon was fully examined.   COLON FINDINGS: Three sessile polyps ranging between 3-60mm in size were found in the ascending colon (1) and transverse colon (2). Polypectomy was performed using cold snare.  All resections were complete and all polyp tissue was completely retrieved.   Five sessile polyps ranging between 3-74mm in size were found in the sigmoid colon (1) and rectosigmoid colon (4).  Polypectomy was performed using cold snare.  All resections were complete and all polyp tissue was completely retrieved.   The colon mucosa was otherwise normal.  Retroflexed views revealed no abnormalities. The time to cecum=4 minutes 33 seconds.  Withdrawal time=17  minutes 39 seconds.  The scope was withdrawn and the procedure completed. COMPLICATIONS: There were no complications.  ENDOSCOPIC IMPRESSION: 1.   Three sessile polyps ranging between 3-94mm in size were found in the ascending colon and transverse colon; Polypectomy was performed using cold snare 2.   Five sessile polyps ranging between 3-33mm in size were found in the sigmoid colon and rectosigmoid colon; Polypectomy was performed using cold snare 3.   The colon mucosa was otherwise normal  RECOMMENDATIONS: 1.  Await pathology results 2.  Timing of repeat colonoscopy will be determined by pathology findings. 3.  You will receive a letter within 1-2 weeks with the results of your biopsy as well as final recommendations.  Please call my office if you have not received a letter after 3 weeks.   eSigned:  Jerene Bears, MD 11/13/2013 4:09 PM   cc: The Patient and Debbrah Alar FNP   PATIENT NAME:  Dean Palmer, Dean Palmer MR#: [id number]

## 2013-11-13 NOTE — Progress Notes (Signed)
Report to PACU, RN, vss, BBS= Clear.  

## 2013-11-13 NOTE — Progress Notes (Signed)
Called to room to assist during endoscopic procedure.  Patient ID and intended procedure confirmed with present staff. Received instructions for my participation in the procedure from the performing physician.  

## 2013-11-13 NOTE — Op Note (Signed)
[place]  [address]. [place], [address]   ENDOSCOPY PROCEDURE REPORT  PATIENT: Dean, Palmer  MR#: [id number] BIRTHDATE: 11-29-1975 , 37  yrs. old GENDER: Male ENDOSCOPIST: Jerene Bears, MD REFERRED BY:   Elyn Aquas, PA-C PROCEDURE DATE:  11/13/2013 PROCEDURE:  EGD w/ biopsy for H.pylori ASA CLASS:     Class II INDICATIONS:  GERD, nausea, coffee ground emesis MEDICATIONS: MAC sedation, administered by CRNA and propofol (Diprivan) 150mg  IV TOPICAL ANESTHETIC: none  DESCRIPTION OF PROCEDURE: After the risks benefits and alternatives of the procedure were thoroughly explained, informed consent was obtained.  The LB [id number] endoscope was introduced through the mouth and advanced to the second portion of the duodenum. Without limitations.  The instrument was slowly withdrawn as the mucosa was fully examined.      The upper, middle and distal third of the esophagus were carefully inspected and no abnormalities were noted.  The z-line was well seen at the GEJ.  The endoscope was pushed into the fundus which was normal including a retroflexed view.  The antrum, gastric body, first and second part of the duodenum were unremarkable.  Biopsies were taken in the antrum, body, and angularis.  Retroflexed views revealed no abnormalities.     The scope was then withdrawn from the patient and the procedure completed.  COMPLICATIONS: There were no complications.  ENDOSCOPIC IMPRESSION: Normal EGD; biopsies were taken in the antrum and angularis  RECOMMENDATIONS: 1.  Await biopsy results 2.  Follow-up of helicobacter pylori status, treat if indicated  eSigned:  Jerene Bears, MD 11/13/2013 4:04 PM   CC:The Patient; Elyn Aquas, PA-C

## 2013-11-13 NOTE — Patient Instructions (Signed)
YOU HAD AN ENDOSCOPIC PROCEDURE TODAY AT THE [facility]: Refer to the procedure report that was given to you for any specific questions about what was found during the examination.  If the procedure report does not answer your questions, please call your gastroenterologist to clarify.  If you requested that your care partner not be given the details of your procedure findings, then the procedure report has been included in a sealed envelope for you to review at your convenience later.  YOU SHOULD EXPECT: Some feelings of bloating in the abdomen. Passage of more gas than usual.  Walking can help get rid of the air that was put into your GI tract during the procedure and reduce the bloating. If you had a lower endoscopy (such as a colonoscopy or flexible sigmoidoscopy) you may notice spotting of blood in your stool or on the toilet paper. If you underwent a bowel prep for your procedure, then you may not have a normal bowel movement for a few days.  DIET: Your first meal following the procedure should be a light meal and then it is ok to progress to your normal diet.  A half-sandwich or bowl of soup is an example of a good first meal.  Heavy or fried foods are harder to digest and may make you feel nauseous or bloated.  Likewise meals heavy in dairy and vegetables can cause extra gas to form and this can also increase the bloating.  Drink plenty of fluids but you should avoid alcoholic beverages for 24 hours.  ACTIVITY: Your care partner should take you home directly after the procedure.  You should plan to take it easy, moving slowly for the rest of the day.  You can resume normal activity the day after the procedure however you should NOT DRIVE or use heavy machinery for 24 hours (because of the sedation medicines used during the test).    SYMPTOMS TO REPORT IMMEDIATELY: A gastroenterologist can be reached at any hour.  During normal business hours, 8:30 AM to 5:00 PM Monday through Friday,  call [phone].  After hours and on weekends, please call the GI answering service at [phone] who will take a message and have the physician on call contact you.   Following lower endoscopy (colonoscopy or flexible sigmoidoscopy):  Excessive amounts of blood in the stool  Significant tenderness or worsening of abdominal pains  Swelling of the abdomen that is new, acute  Fever of 100F or higher  Following upper endoscopy (EGD)  Vomiting of blood or coffee ground material  New chest pain or pain under the shoulder blades  Painful or persistently difficult swallowing  New shortness of breath  Fever of 100F or higher  Black, tarry-looking stools  FOLLOW UP: If any biopsies were taken you will be contacted by phone or by letter within the next 1-3 weeks.  Call your gastroenterologist if you have not heard about the biopsies in 3 weeks.  Our staff will call the home number listed on your records the next business day following your procedure to check on you and address any questions or concerns that you may have at that time regarding the information given to you following your procedure. This is a courtesy call and so if there is no answer at the home number and we have not heard from you through the emergency physician on call, we will assume that you have returned to your regular daily activities without incident.  SIGNATURES/CONFIDENTIALITY: You and/or your care   partner have signed paperwork which will be entered into your electronic medical record.  These signatures attest to the fact that that the information above on your After Visit Summary has been reviewed and is understood.  Full responsibility of the confidentiality of this discharge information lies with you and/or your care-partner.  Await biopsies from upper GI endoscopy.  Polyp information given, await biopsy results.

## 2013-11-14 ENCOUNTER — Telehealth: Payer: Self-pay | Admitting: *Deleted

## 2013-11-14 NOTE — Telephone Encounter (Signed)
  Follow up Call-  Call back number 11/13/2013  Post procedure Call Back phone  # [phone]  Permission to leave phone message Yes     Patient questions:  Do you have a fever, pain , or abdominal swelling? No. Pain Score  0 *  Have you tolerated food without any problems? Yes.    Have you been able to return to your normal activities? Yes.    Do you have any questions about your discharge instructions: Diet   No. Medications  No. Follow up visit  No.  Do you have questions or concerns about your Care? No.  Actions: * If pain score is 4 or above: No action needed, pain <4.

## 2013-11-30 ENCOUNTER — Emergency Department (HOSPITAL_BASED_OUTPATIENT_CLINIC_OR_DEPARTMENT_OTHER)
Admission: EM | Admit: 2013-11-30 | Discharge: 2013-11-30 | Discharge status: Against Medical Advice | Payer: 59 | Attending: Emergency Medicine | Admitting: Emergency Medicine

## 2013-11-30 ENCOUNTER — Encounter (HOSPITAL_BASED_OUTPATIENT_CLINIC_OR_DEPARTMENT_OTHER): Payer: Self-pay | Admitting: Emergency Medicine

## 2013-11-30 DIAGNOSIS — R197 Diarrhea, unspecified: Secondary | ICD-10-CM | POA: Diagnosis not present

## 2013-11-30 DIAGNOSIS — R112 Nausea with vomiting, unspecified: Secondary | ICD-10-CM | POA: Diagnosis present

## 2013-11-30 NOTE — ED Notes (Signed)
Abdominal pain x 2 days. Nausea, vomiting and diarrhea.

## 2013-12-01 ENCOUNTER — Ambulatory Visit (INDEPENDENT_AMBULATORY_CARE_PROVIDER_SITE_OTHER): Payer: 59 | Admitting: Medical

## 2013-12-01 ENCOUNTER — Encounter: Payer: Self-pay | Admitting: Medical

## 2013-12-01 ENCOUNTER — Telehealth: Payer: Self-pay | Admitting: Family

## 2013-12-01 ENCOUNTER — Encounter: Payer: Self-pay | Admitting: Internal Medicine

## 2013-12-01 VITALS — BP 105/73 | HR 91 | Temp 99.4°F | Ht 73.5 in | Wt 164.4 lb

## 2013-12-01 DIAGNOSIS — R1013 Epigastric pain: Secondary | ICD-10-CM

## 2013-12-01 DIAGNOSIS — R112 Nausea with vomiting, unspecified: Secondary | ICD-10-CM | POA: Insufficient documentation

## 2013-12-01 LAB — CBC WITH DIFFERENTIAL/PLATELET
BASOS ABS: 0 10*3/uL (ref 0.0–0.1)
Basophils Relative: 0.5 % (ref 0.0–3.0)
Eosinophils Absolute: 0.1 10*3/uL (ref 0.0–0.7)
Eosinophils Relative: 0.9 % (ref 0.0–5.0)
HEMATOCRIT: 45 % (ref 39.0–52.0)
HEMOGLOBIN: 15.2 g/dL (ref 13.0–17.0)
Lymphocytes Relative: 27 % (ref 12.0–46.0)
Lymphs Abs: 2.4 10*3/uL (ref 0.7–4.0)
MCHC: 33.7 g/dL (ref 30.0–36.0)
MCV: 89 fl (ref 78.0–100.0)
MONOS PCT: 9.4 % (ref 3.0–12.0)
Monocytes Absolute: 0.8 10*3/uL (ref 0.1–1.0)
NEUTROS ABS: 5.5 10*3/uL (ref 1.4–7.7)
Neutrophils Relative %: 62.2 % (ref 43.0–77.0)
Platelets: 230 10*3/uL (ref 150.0–400.0)
RBC: 5.06 Mil/uL (ref 4.22–5.81)
RDW: 13.7 % (ref 11.5–15.5)
WBC: 8.8 10*3/uL (ref 4.0–10.5)

## 2013-12-01 LAB — COMPREHENSIVE METABOLIC PANEL
ALT: 29 U/L (ref 0–53)
AST: 32 U/L (ref 0–37)
Albumin: 4.5 g/dL (ref 3.5–5.2)
Alkaline Phosphatase: 41 U/L (ref 39–117)
BILIRUBIN TOTAL: 1.8 mg/dL — AB (ref 0.2–1.2)
BUN: 19 mg/dL (ref 6–23)
CALCIUM: 9.4 mg/dL (ref 8.4–10.5)
CO2: 31 mEq/L (ref 19–32)
CREATININE: 1.1 mg/dL (ref 0.4–1.5)
Chloride: 96 mEq/L (ref 96–112)
GFR: 82.71 mL/min (ref >60.00)
Glucose, Bld: 81 mg/dL (ref 70–99)
Potassium: 3.4 mEq/L — ABNORMAL LOW (ref 3.5–5.1)
Sodium: 136 mEq/L (ref 135–145)
Total Protein: 8.1 g/dL (ref 6.0–8.3)

## 2013-12-01 LAB — LIPASE: LIPASE: 19 U/L (ref 11.0–59.0)

## 2013-12-01 MED ORDER — ONDANSETRON 8 MG PO TBDP: 8.0000 mg | ORAL_TABLET | Freq: Three times a day (TID) | ORAL | Status: DC | PRN

## 2013-12-01 NOTE — Assessment & Plan Note (Addendum)
Get back on protonix and bentyl. Symptoms started as he stopped med for 2-3 days and ate fast foods. Rx zofran for vomiting. If unable to keep propel and bland foods down then ED for iv hydration. Vitals stable now.

## 2013-12-01 NOTE — Telephone Encounter (Signed)
I see pt went to ED yesterday- did he leave before being seen?  No results in EPIC.  Is he still having abdominal pain? If so, he should be seen in our office today.

## 2013-12-01 NOTE — Progress Notes (Signed)
   Subjective:    Patient ID: Dean Palmer, male    DOB: [birth year], 38 y.o.   MRN: [id number]  HPI  Pt in states he started getting sick on Tuesday. Ate a piece of pizza. Recently out of town and eating poorly. He vomited a lot on Tuesday. Regardless of what he drank or ate. Pt states the vomiting is tapering off. He has not vomited since 6-7 oclock. Last night. Pt drank some water today. Pt had some diarrhea on wed and little mild loose Thursday am. No diarrhea since. August he had colonsocpy and egd.  Pt had polyps in colon and stomach. Pt they found reflux. Pt is on protonix and bently. He did not take medication until last night. He forgot medication the 2 days he was sick. Pt has not drank any alcohol. Hx of alcoholism. No blood in his stools.  Pt did go to ED last night but then he got inpatient last night and left.    Review of Systems  Constitutional: Negative for fever, chills and fatigue.  HENT: Negative.   Respiratory: Negative for cough, choking, chest tightness and wheezing.   Cardiovascular: Negative for chest pain and palpitations.  Gastrointestinal: Positive for nausea, vomiting, abdominal pain and diarrhea. Negative for constipation, blood in stool and anal bleeding.       Abdomen pain resolved but worse at onset. Vomiting none since last night. Mild nauseu now. Loose stool first days and now none since.  Genitourinary: Negative.   Musculoskeletal: Negative.        Only last night muscles felt tight and like about to cramp but this did not occur and none presently during interview.  Skin: Negative.   Neurological: Negative.   Hematological: Negative.   Psychiatric/Behavioral: Negative.        Objective:   Physical Exam  General Appearance- Not in acute distress.  HEENT Eyes- Scleraeral/Conjuntiva-bilat- Not Yellow. Mouth & Throat- Normal.  Chest and Lung Exam Auscultation: Breath sounds:-Normal. Adventitious sounds:- No Adventitious  sounds.  Cardiovascular Auscultation:Rythm - Regular. Heart Sounds -Normal heart sounds.  Abdomen Inspection:-Inspection Normal.  Palpation/Perucssion: Palpation and Percussion of the abdomen reveal- Non Tender, No Rebound tenderness, No rigidity(Guarding) and No Palpable abdominal masses.  Liver:-Normal.  Spleen:- Normal.   Skin- moist. NO tenting.        Assessment & Plan:

## 2013-12-01 NOTE — Assessment & Plan Note (Signed)
Zofran rx. See plan of abdominal pain.

## 2013-12-01 NOTE — Telephone Encounter (Signed)
Pt seen by PA, Saguier on 12/01/13.

## 2013-12-01 NOTE — Telephone Encounter (Signed)
Noted  

## 2013-12-01 NOTE — Patient Instructions (Signed)
You may have had gastrointestinal infection from food you ate or severe flair of your reflux based on your stopping of protonix and poor diet. I want you to get labs done today to assess your condition. Restart your protonix and bentyl. I am prescribing zofran and sending to your pharmacy. I want you to take zofran and then start hydration with propel fitness water and bland foods. If you are unable to keep these down despite the zofran then go to ED for iv hydration. Follow up in 7 days or as needed.

## 2013-12-27 ENCOUNTER — Ambulatory Visit (INDEPENDENT_AMBULATORY_CARE_PROVIDER_SITE_OTHER): Payer: 59 | Admitting: Family

## 2013-12-27 ENCOUNTER — Encounter: Payer: Self-pay | Admitting: Family

## 2013-12-27 VITALS — BP 130/80 | HR 109 | Temp 99.4°F | Ht 73.5 in | Wt 169.2 lb

## 2013-12-27 DIAGNOSIS — K122 Cellulitis and abscess of mouth: Secondary | ICD-10-CM

## 2013-12-27 MED ORDER — AMOXICILLIN 500 MG PO CAPS: 500.0000 mg | ORAL_CAPSULE | Freq: Two times a day (BID) | ORAL | Status: DC

## 2013-12-27 NOTE — Progress Notes (Signed)
Pre visit review using our clinic review tool, if applicable. No additional management support is needed unless otherwise documented below in the visit note. 

## 2013-12-27 NOTE — Patient Instructions (Signed)
Thank you for choosing [facility].  Summary/Instructions:   Your prescription has been sent to your pharmacy  If you do not notice any improvement in 2-3 days please let us know or if symptoms worsen.

## 2013-12-27 NOTE — Progress Notes (Signed)
   Subjective:    Patient ID: Dean Palmer, male    DOB: 1975/07/09, 38 y.o.   MRN: [id number]  HPI:  Dean Palmer is a 38 y.o. male who presents today potential ear infection/vertigo. Indicates that he has a history of really bad teeth and a couple of weeks ago he had a tooth break off. He has been working to save up money for a dentist visit, but has developed swelling and pain on the left side of his face which has resulted in him being dizzy. He recalls having chills and night sweats occasionally throughout the last week. The pain has been significant enough to keep him out of work. He also notes that he a stomach virus on top of the current condition which has since resolved. He attempted to take 2 leftover ABX that he had previously which seemed to help. Dentist has referred him here for treatment prior to dental work.      Allergies  Allergen Reactions  . Tramadol Nausea Only    Current Outpatient Prescriptions on File Prior to Visit  Medication Sig Dispense Refill  . dicyclomine (BENTYL) 20 MG tablet Take 1 tablet (20 mg total) by mouth 3 (three) times daily as needed for spasms.  90 tablet  5  . pantoprazole (PROTONIX) 40 MG tablet Take 1 tablet (40 mg total) by mouth daily.  30 tablet  11   No current facility-administered medications on file prior to visit.    Past Medical History  Diagnosis Date  . GERD (gastroesophageal reflux disease)   . Bipolar 1 disorder   . Arthritis     DDD Lumbar  . Ulcer     associated with alcohol use  . Depression     followed by Candis Schatz; followed every six months to nine months.  . Alcoholism   . Anxiety   . HLD (hyperlipidemia)     now under control   Review of Systems  See HPI    Objective:     BP 130/80  Pulse 109  Temp(Src) 99.4 F (37.4 C) (Oral)  Ht 6' 1.5" (1.867 m)  Wt 169 lb 3.2 oz (76.749 kg)  BMI 22.02 kg/m2  SpO2 96% Nursing note and vital signs reviewed.  Physical Exam  Constitutional: He is oriented  to person, place, and time. He appears well-developed. No distress.  HENT:  Head: Normocephalic.  Right Ear: Hearing, tympanic membrane and external ear normal.  Left Ear: Hearing, tympanic membrane and external ear normal.  Mouth/Throat: Abnormal dentition (Poor denitition). Oropharyngeal exudate present.  External ear canals with some errythemia bilaterally.   Mouth: Edema and exudate noted left lower mouth. Edema of left face noted. Palpable lymphadenopathy present.   Neck: Neck supple.  Cardiovascular: Normal rate, regular rhythm and normal heart sounds.   Pulmonary/Chest: Effort normal and breath sounds normal.  Neurological: He is alert and oriented to person, place, and time.  Skin: Skin is warm.  Psychiatric: He has a normal mood and affect. His behavior is normal. Judgment and thought content normal.        Assessment & Plan:

## 2013-12-27 NOTE — Assessment & Plan Note (Signed)
Start amoxicillin. If no improvement in 2-3 days or symptoms worsen or fail to improve, notify office. Keep dentist appointments to have teeth fixed. Information discussed with patient and he is in agreement.

## 2015-02-03 IMAGING — CT CT ABD-PELV W/ CM
2 of 4 series · 16 of 46 positions shown, 18 images · IV contrast (APPLIED)
Comparison: None.

CLINICAL DATA: Abdominal pain

EXAM:
CT ABDOMEN AND PELVIS WITH CONTRAST
TECHNIQUE: Multidetector CT imaging of the abdomen and pelvis was performed
using the standard protocol following bolus administration of
intravenous contrast.
CONTRAST:  100mL OMNIPAQUE IOHEXOL 300 MG/ML  SOLN

[Series 2: abd/pelvis 5.0 b31f · axial · 0.82mm/px · z∈[-483,-48]mm · 13 of 95 slices shown, 15 images]
[im 4/95  soft-tissue]
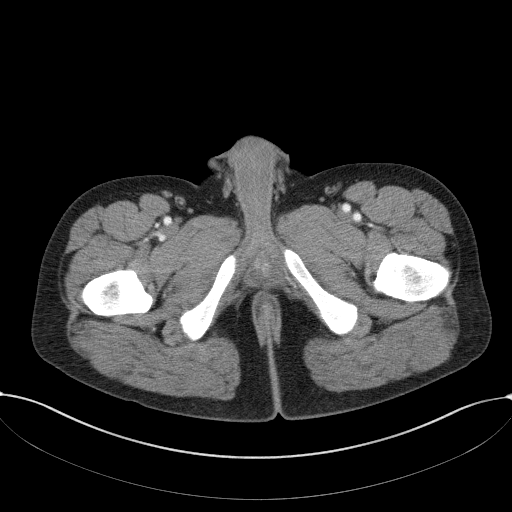
[im 4/95  bone]
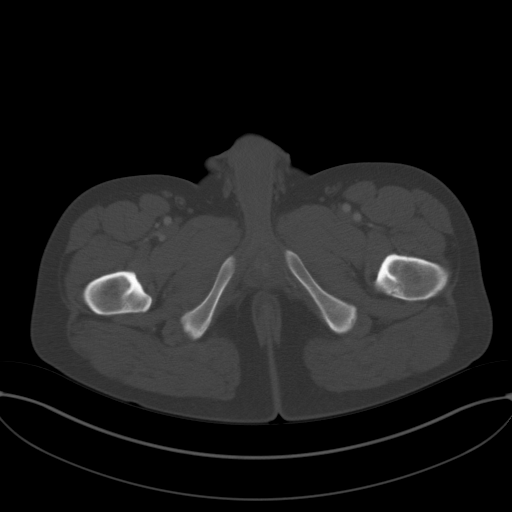
[im 12/95  soft-tissue]
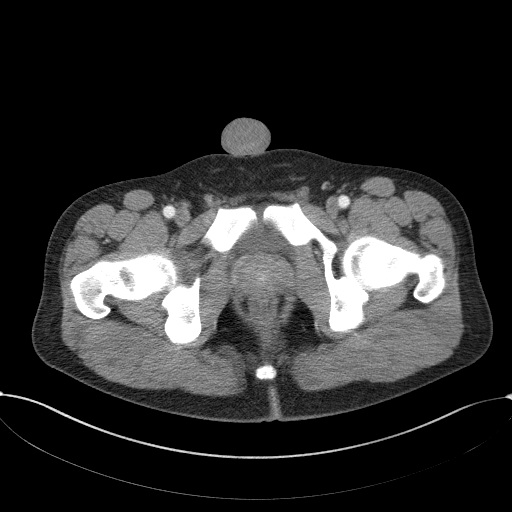
[im 20/95  soft-tissue]
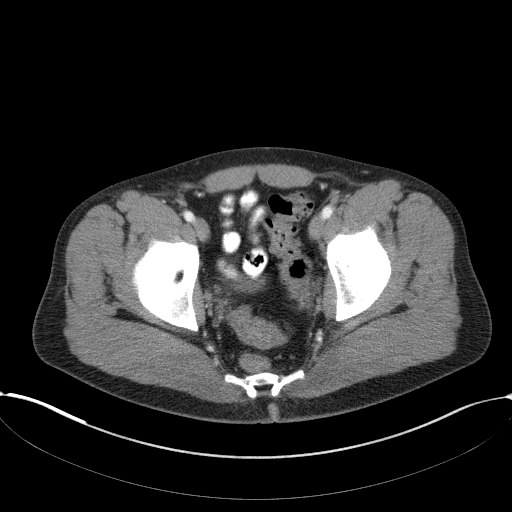
[im 28/95  soft-tissue]
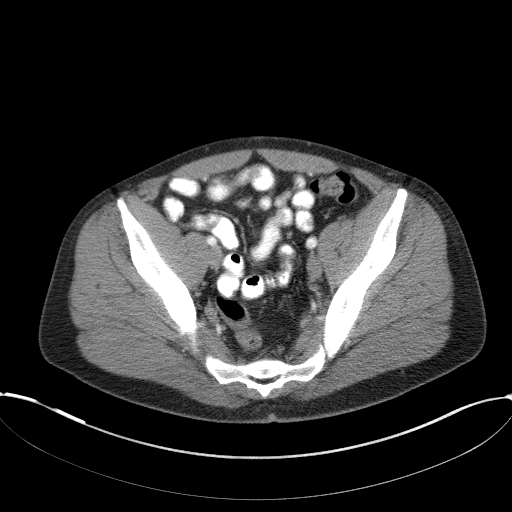
[im 32/95  soft-tissue]
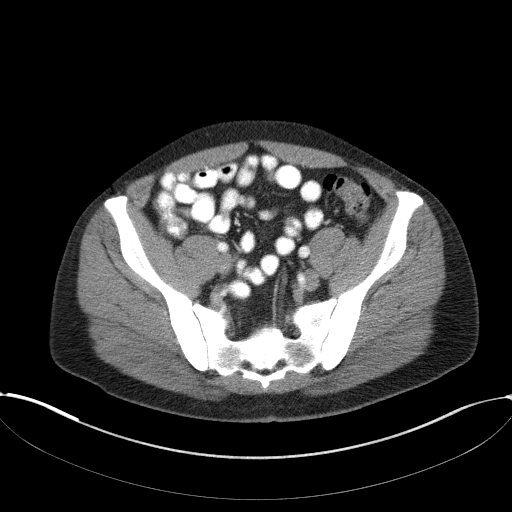
[im 40/95  soft-tissue]
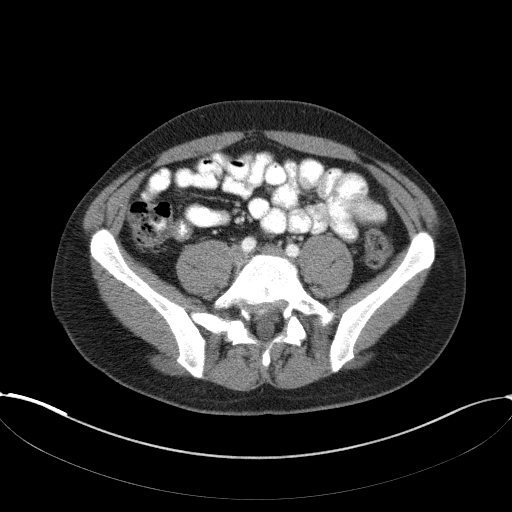
[im 48/95  soft-tissue]
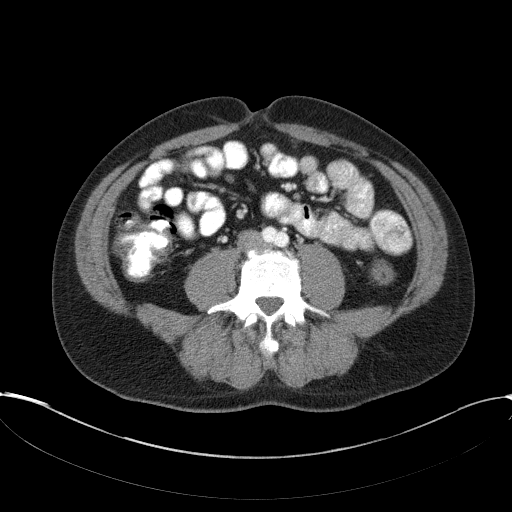
[im 55/95  soft-tissue]
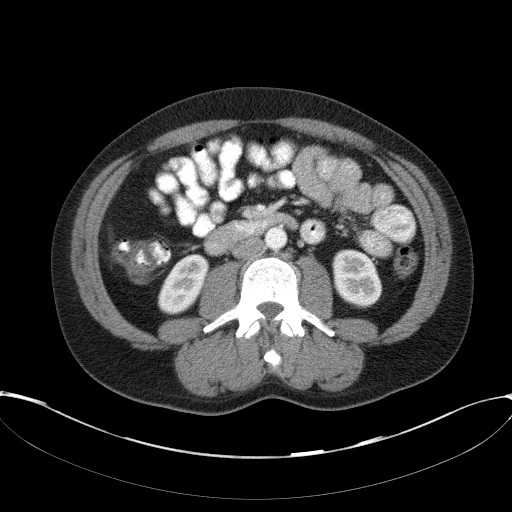
[im 63/95  soft-tissue]
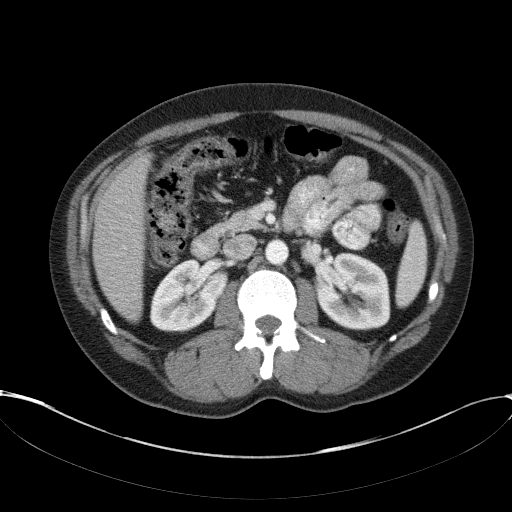
[im 63/95  bone]
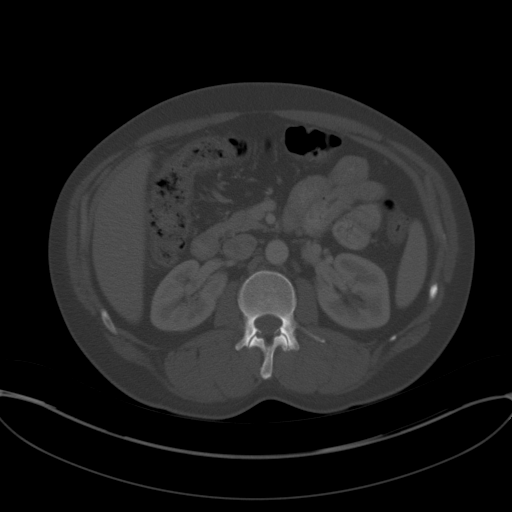
[im 67/95  soft-tissue]
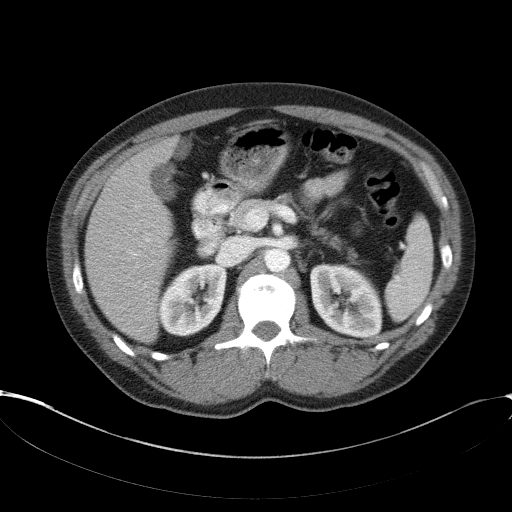
[im 75/95  soft-tissue]
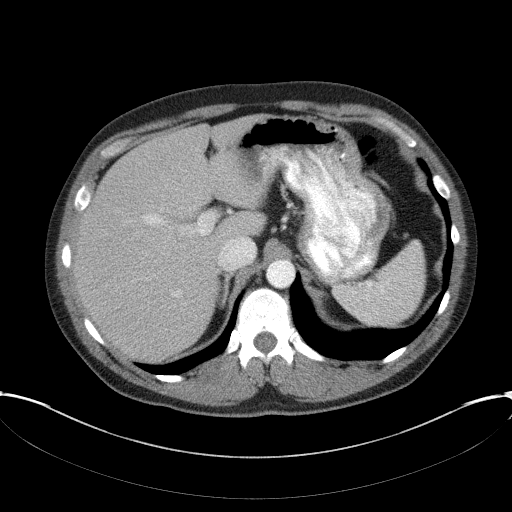
[im 83/95  soft-tissue]
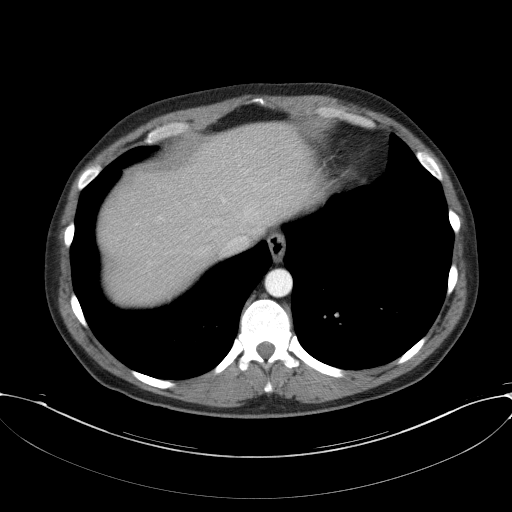
[im 91/95  soft-tissue]
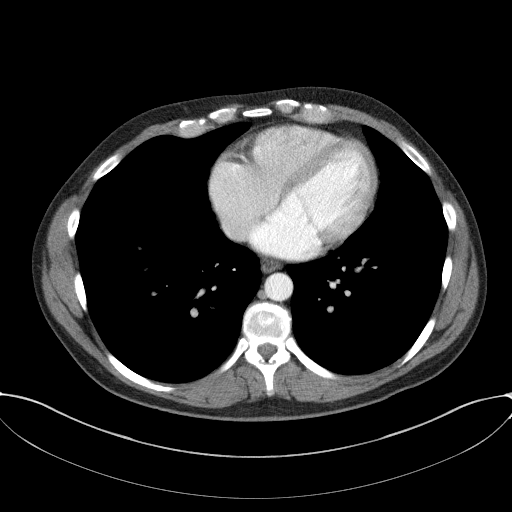

[Series 5: abd/pelvis 3.0 coronal · coronal · 0.92mm/px · 3 of 83 slices shown]
[im 28/83  soft-tissue]
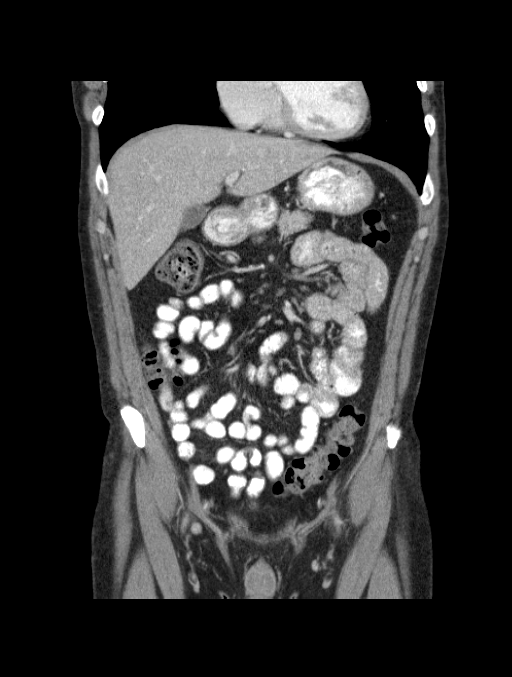
[im 37/83  soft-tissue]
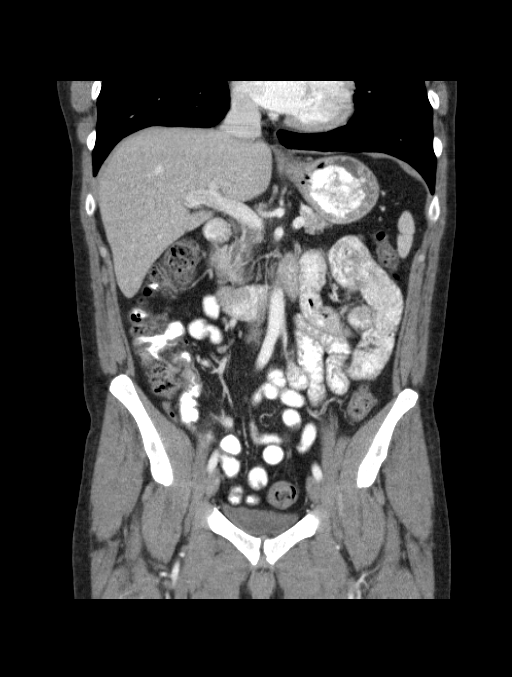
[im 46/83  soft-tissue]
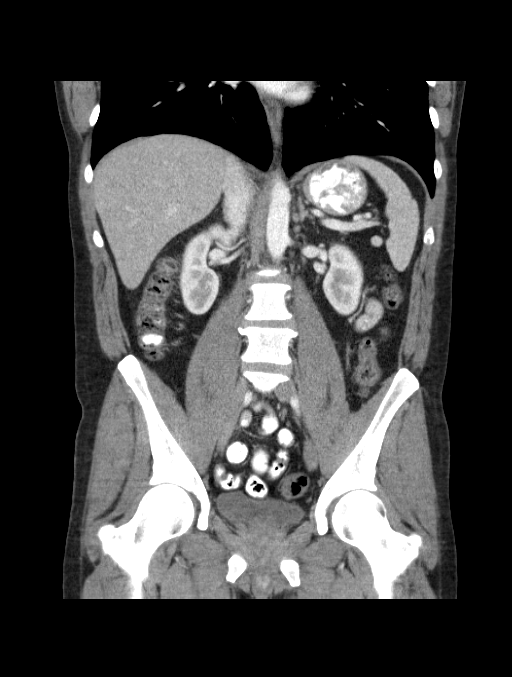

[16 of 46 positions shown; findings below may reference images not displayed]

FINDINGS: The lung bases are clear. No pleural or pericardial effusion
identified.

No focal liver abnormality. The gallbladder appears normal. No
biliary dilatation. Normal appearance of the pancreas. The spleen is
unremarkable.

The adrenal glands are both normal. Normal appearance of the
kidneys. The urinary bladder appears normal. The prostate gland and
seminal vesicles are on unremarkable.

Normal caliber of the abdominal aorta. There is no aneurysm. No
upper abdominal adenopathy identified. There is no pelvic or
inguinal adenopathy identified. The stomach appears normal. The
small bowel loops have a normal course and caliber and there is no
evidence for obstruction. The appendix is visualized, image
62/series 2. There is no evidence for acute appendicitis.

Normal appearance of the colon review of the visualized bony
structures is significant for bilateral L5 pars defects. There is a
first degree anterolisthesis of L5 on S1.
IMPRESSION: 1. No acute findings identified within the abdomen or pelvis.
2. To the appendix is visualized and is normal.
3. Bilateral L5 pars defects.

## 2015-05-19 ENCOUNTER — Encounter (HOSPITAL_COMMUNITY): Payer: Self-pay

## 2015-05-19 ENCOUNTER — Emergency Department (HOSPITAL_COMMUNITY)
Admission: EM | Admit: 2015-05-19 | Discharge: 2015-05-20 | Disposition: A | Discharge status: Home / Self Care | Payer: Self-pay | Attending: Emergency Medicine | Admitting: Emergency Medicine

## 2015-05-19 DIAGNOSIS — K219 Gastro-esophageal reflux disease without esophagitis: Secondary | ICD-10-CM | POA: Insufficient documentation

## 2015-05-19 DIAGNOSIS — Z87891 Personal history of nicotine dependence: Secondary | ICD-10-CM | POA: Insufficient documentation

## 2015-05-19 DIAGNOSIS — G8929 Other chronic pain: Secondary | ICD-10-CM | POA: Insufficient documentation

## 2015-05-19 DIAGNOSIS — M545 Low back pain, unspecified: Secondary | ICD-10-CM

## 2015-05-19 DIAGNOSIS — Z8659 Personal history of other mental and behavioral disorders: Secondary | ICD-10-CM | POA: Insufficient documentation

## 2015-05-19 DIAGNOSIS — Z8639 Personal history of other endocrine, nutritional and metabolic disease: Secondary | ICD-10-CM | POA: Insufficient documentation

## 2015-05-19 MED ORDER — METHOCARBAMOL 500 MG PO TABS
1000.0000 mg | ORAL_TABLET | ORAL | Status: AC
  Administered 2015-05-19: 1000 mg via ORAL
  Filled 2015-05-19: qty 2

## 2015-05-19 MED ORDER — KETOROLAC TROMETHAMINE 30 MG/ML IJ SOLN
30.0000 mg | Freq: Once | INTRAMUSCULAR | Status: AC
  Administered 2015-05-19: 30 mg via INTRAVENOUS
  Filled 2015-05-19: qty 1

## 2015-05-19 MED ORDER — KETOROLAC TROMETHAMINE 60 MG/2ML IM SOLN
60.0000 mg | Freq: Once | INTRAMUSCULAR | Status: DC
Start: 2015-05-19 — End: 2015-05-19

## 2015-05-19 MED ORDER — DEXAMETHASONE SODIUM PHOSPHATE 10 MG/ML IJ SOLN
10.0000 mg | Freq: Once | INTRAMUSCULAR | Status: AC
  Administered 2015-05-19: 10 mg via INTRAMUSCULAR
  Filled 2015-05-19: qty 1

## 2015-05-19 NOTE — ED Provider Notes (Signed)
CSN: [id number]     Arrival date & time 05/19/15  2121 History   First MD Initiated Contact with Patient 05/19/15 2130     Chief Complaint  Patient presents with  . Back Pain     (Consider location/radiation/quality/duration/timing/severity/associated sxs/prior Treatment) HPI   Pt with hx chronic low back pain presents with worsening pain over the past two days, described as pressure/sharp/dull, has tingling into the lateral left leg. This is located in the same area as his chronic pain that he has had for the past 5 years. Taking only tylenol at home for the pain because he no longer has insurance or PCP.  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  Denies fevers, chills, abdominal pain, loss of control of bowel or bladder, weakness of the extremities, saddle anesthesia, bowel or urinary complaints.   Denies hx CA, IVDU.   Past Medical History  Diagnosis Date  . GERD (gastroesophageal reflux disease)   . Bipolar 1 disorder ([place])   . Arthritis     DDD Lumbar  . Ulcer     associated with alcohol use  . Depression     followed by Candis Schatz; followed every six months to nine months.  . Alcoholism ([place])   . Anxiety   . HLD (hyperlipidemia)     now under control   Past Surgical History  Procedure Laterality Date  . Wisdom tooth extraction     Family History  Problem Relation Age of Onset  . Arthritis Father   . Lung cancer Other     MG aunt x 4  . Diabetes Father   . Prostate cancer Paternal Grandfather     or possibly colon cancer  . Asthma Mother   . COPD Mother   . Colon polyps Father    Social History  Substance Use Topics  . Smoking status: Former Smoker -- 0.25 packs/day    Types: Cigarettes    Quit date: 11/28/2013  . Smokeless tobacco: Never Used  . Alcohol Use: Yes     Comment: occasional     Review of Systems  Constitutional: Negative for fever and chills.  Cardiovascular: Negative for chest pain.   Gastrointestinal: Negative for nausea, vomiting, abdominal pain and diarrhea.  Genitourinary: Negative for dysuria, urgency and frequency.  Musculoskeletal: Positive for back pain. Negative for myalgias.  Skin: Negative for rash.  Allergic/Immunologic: Negative for immunocompromised state.  Neurological: Negative for weakness.  Psychiatric/Behavioral: Negative for self-injury.      Allergies  Tramadol  Home Medications   Prior to Admission medications   Medication Sig Start Date End Date Taking? Authorizing Provider  acetaminophen (TYLENOL) 500 MG tablet Take 1,500 mg by mouth every 8 (eight) hours as needed for mild pain, moderate pain or headache.   Yes Historical Provider, MD  cyclobenzaprine (FLEXERIL) 10 MG tablet Take 10 mg by mouth once.   Yes Historical Provider, MD  dicyclomine (BENTYL) 20 MG tablet Take 1 tablet (20 mg total) by mouth 3 (three) times daily as needed for spasms. Patient not taking: Reported on 05/19/2015 10/12/13   Jerene Bears, MD  pantoprazole (PROTONIX) 40 MG tablet Take 1 tablet (40 mg total) by mouth daily. Patient not taking: Reported on 05/19/2015 10/12/13   Jerene Bears, MD   There were no vitals taken for this visit. Physical Exam  Constitutional: He appears well-developed and well-nourished. No distress.  Pt calm and looking around until I approached, then began  to grimace and cry as he spoke.    HENT:  Head: Normocephalic and atraumatic.  Neck: Neck supple.  Pulmonary/Chest: Effort normal.  Abdominal: Soft. He exhibits no distension. There is no tenderness. There is no rebound and no guarding.  Musculoskeletal:  Tenderness throughout lumbar spine without focal tenderness.  No overlying skin changes.  Spine otherwise nontender, no crepitus, or stepoffs. Lower extremities:  Pt declines to do more than flick his great toe bilaterally for strength testing but then uses his legs to push himself onto his right side so I can examine his back.    Sensation intact, pt states altered sensation throughout left leg.    Neurological: He is alert.  Skin: He is not diaphoretic.  Nursing note and vitals reviewed. Straight leg raise negative.    ED Course  Procedures (including critical care time) Labs Review Labs Reviewed - No data to display  Imaging Review No results found. I have personally reviewed and evaluated these images and lab results as part of my medical decision-making.   EKG Interpretation None       12:06 AM On reexamination, pt states he still has pain in his back, not radiating.  Denies weakness/numbness, states it is just painful to move his legs, pulls his back.  He is able to hold his legs off the bed and lower them slowly when I lift them.    Prior to discharge, patient ambulated in the hallway.  He felt pain with standing but was able to swing his legs off the bed without difficulty and his gait was slow but normal.  Picked both feet up off the ground and swung each leg forward symmetrically.    MDM   Final diagnoses:  Acute exacerbation of chronic low back pain    Afebrile, nontoxic patient with exacerbation of chronic back pain.  No red flags with history or exam.  Neurovascularly intact.  Emergent imaging not indicated at this time.   D/C home with mobic, robaxin, PCP follow up, resources given.  Discussed result, findings, treatment, and follow up  with patient.  Pt given return precautions.  Pt verbalizes understanding and agrees with plan.           [place], PA-C 05/20/15 0123  Sherwood Gambler, MD 05/21/15 [phone]

## 2015-05-19 NOTE — ED Notes (Signed)
Bed: [facility] Expected date:  Expected time:  Means of arrival:  Comments: Ems back pain

## 2015-05-19 NOTE — ED Notes (Signed)
Pt BIB [facility] from home c/o worsening chronic back pain (lumbar region). Pt reports numbness radiating down his L leg. Pt tearful in triage. Pt pain10/10.

## 2015-05-20 MED ORDER — METHOCARBAMOL 500 MG PO TABS: 500.0000 mg | ORAL_TABLET | Freq: Four times a day (QID) | ORAL | Status: DC | PRN

## 2015-05-20 MED ORDER — MELOXICAM 7.5 MG PO TABS: 7.5000 mg | ORAL_TABLET | Freq: Every day | ORAL | Status: DC | PRN

## 2015-05-20 NOTE — Discharge Instructions (Signed)
Read the information below.  Use the prescribed medication as directed.  Please discuss all new medications with your pharmacist.  You may return to the Emergency Department at any time for worsening condition or any new symptoms that concern you.     If you develop fevers, loss of control of bowel or bladder, weakness or numbness in your legs, or are unable to walk, return to the ER for a recheck.    Back Pain, Adult Back pain is very common in adults.The cause of back pain is rarely dangerous and the pain often gets better over time.The cause of your back pain may not be known. Some common causes of back pain include:  Strain of the muscles or ligaments supporting the spine.  Wear and tear (degeneration) of the spinal disks.  Arthritis.  Direct injury to the back. For many people, back pain may return. Since back pain is rarely dangerous, most people can learn to manage this condition on their own. HOME CARE INSTRUCTIONS Watch your back pain for any changes. The following actions may help to lessen any discomfort you are feeling:  Remain active. It is stressful on your back to sit or stand in one place for long periods of time. Do not sit, drive, or stand in one place for more than 30 minutes at a time. Take short walks on even surfaces as soon as you are able.Try to increase the length of time you walk each day.  Exercise regularly as directed by your health care provider. Exercise helps your back heal faster. It also helps avoid future injury by keeping your muscles strong and flexible.  Do not stay in bed.Resting more than 1-2 days can delay your recovery.  Pay attention to your body when you bend and lift. The most comfortable positions are those that put less stress on your recovering back. Always use proper lifting techniques, including:  Bending your knees.  Keeping the load close to your body.  Avoiding twisting.  Find a comfortable position to sleep. Use a firm mattress  and lie on your side with your knees slightly bent. If you lie on your back, put a pillow under your knees.  Avoid feeling anxious or stressed.Stress increases muscle tension and can worsen back pain.It is important to recognize when you are anxious or stressed and learn ways to manage it, such as with exercise.  Take medicines only as directed by your health care provider. Over-the-counter medicines to reduce pain and inflammation are often the most helpful.Your health care provider may prescribe muscle relaxant drugs.These medicines help dull your pain so you can more quickly return to your normal activities and healthy exercise.  Apply ice to the injured area:  Put ice in a plastic bag.  Place a towel between your skin and the bag.  Leave the ice on for 20 minutes, 2-3 times a day for the first 2-3 days. After that, ice and heat may be alternated to reduce pain and spasms.  Maintain a healthy weight. Excess weight puts extra stress on your back and makes it difficult to maintain good posture. SEEK MEDICAL CARE IF:  You have pain that is not relieved with rest or medicine.  You have increasing pain going down into the legs or buttocks.  You have pain that does not improve in one week.  You have night pain.  You lose weight.  You have a fever or chills. SEEK IMMEDIATE MEDICAL CARE IF:   You develop new bowel or bladder  control problems.  You have unusual weakness or numbness in your arms or legs.  You develop nausea or vomiting.  You develop abdominal pain.  You feel faint.   This information is not intended to replace advice given to you by your health care provider. Make sure you discuss any questions you have with your health care provider.   Document Released: 03/09/2005 Document Revised: 03/30/2014 Document Reviewed: 07/11/2013 Elsevier Interactive Patient Education 2016 Elsevier Inc.  Chronic Back Pain  When back pain lasts longer than 3 months, it is called  chronic back pain.People with chronic back pain often go through certain periods that are more intense (flare-ups).  CAUSES Chronic back pain can be caused by wear and tear (degeneration) on different structures in your back. These structures include:  The bones of your spine (vertebrae) and the joints surrounding your spinal cord and nerve roots (facets).  The strong, fibrous tissues that connect your vertebrae (ligaments). Degeneration of these structures may result in pressure on your nerves. This can lead to constant pain. HOME CARE INSTRUCTIONS  Avoid bending, heavy lifting, prolonged sitting, and activities which make the problem worse.  Take brief periods of rest throughout the day to reduce your pain. Lying down or standing usually is better than sitting while you are resting.  Take over-the-counter or prescription medicines only as directed by your caregiver. SEEK IMMEDIATE MEDICAL CARE IF:   You have weakness or numbness in one of your legs or feet.  You have trouble controlling your bladder or bowels.  You have nausea, vomiting, abdominal pain, shortness of breath, or fainting.   This information is not intended to replace advice given to you by your health care provider. Make sure you discuss any questions you have with your health care provider.   Document Released: 04/16/2004 Document Revised: 06/01/2011 Document Reviewed: 08/27/2014 Elsevier Interactive Patient Education 2016 [place] [employer] is a great source of information about community services available.  Access by dialing 2-1-1 from anywhere in [place], or by website -  CustodianSupply.fi.   Other Local Resources (Updated 03/2015)  [place]    Phone Number and Address  [place] medical care - 1st and 3rd Saturday of every month  Must not qualify for public or private insurance and  must have limited income [phone] [address] [place], [place]  Child care  Emergency assistance for housing and [employer]  Medicaid [phone] [address], [place] [address]   [facility] Department  Low-cost medical care for children, communicable diseases, sexually-transmitted diseases, immunizations, maternity care, womens health and family planning [phone] [address], [place] [address]  [facility]   Medication assistance for [facility] residents  Must meet income requirements [phone] [place], [place].    [place]  Child care  Emergency assistance for housing and [employer]  Medicaid [phone] [address] [place], [place] [address]  [facility] [place]   Low-cost medical care,   Monday through Friday, 9 am to 6 pm.   Accepts Medicare/Medicaid, and self-pay [phone] [address] [place],  [address]  [facility] for [place] care - Monday through Friday, 8:30 am - 5:30 pm  Accepts Medicaid and self-pay [phone] [address], [place], [address] [address]   [facility]  Primary medical care, including for those with sickle cell disease  Accepts Medicare, Medicaid, insurance and self-pay [phone] [place], [place]  [facility]   Primary medical care  Accepts Medicare, [place], insurance and self-pay [phone] [address] [place], [place] [address]   [facility] Department of Social Services  Child care  Emergency assistance for housing and [employer]  Medicaid [phone] [address] [place], [place] [address]  [place] Department of Health and [employer]  Child care  Emergency assistance for housing and [employer]  Medicaid [phone] [place], [place] [address]   [facility] Medication Assistance Program  Medication assistance for [facility] residents with no insurance only  Must have a primary care doctor [phone] [address], [place], [place]  [facility]   Primary medical care  [place], [place], insurance  [phone] [address] [place], [place]  [facility]   Medication assistance [phone]  [facility]   Primary medical care  Accepts Medicare, [place], insurance and self-pay [phone] [address], [place] [address]  [facility]   Primary medical care  Accepts Medicare, [place], insurance and self-pay [phone] [address], [place] [address]  [facility]  For [place] residents between the ages of 66 and 44 who do not have any form of health insurance, Medicare, [place], or [place] benefits.  Services are provided free of charge to uninsured patients who fall within federal poverty guidelines.    Hours: Tuesdays and Thursdays, 4:15 - 8 pm [phone] [address] [place], [place] [address]  [facility]     Primary medical care  Dental care  Nutritional counseling  Pharmacy  Accepts Medicaid, Medicare, most insurance.  Fees are adjusted based on ability to pay.   [place], [place] [address], [place], [place] [facility], [place], [facility] [place], [place]  Planned  Parenthood  Womens health and family planning [phone] [place]. [place], National care  Emergency assistance for housing and [employer]  Medicaid [phone] [address] [place], [place] [address]   [facility]    Ages 65 and older  Hours: Mondays and Thursdays, 7:00 am - 9:00 am Patients are seen on a first come, first served basis. [phone], ext. [place], [place]  Child care  Emergency assistance for housing and [employer]  Medicaid [phone] [address], [place] [address]  [facility] [place]  Medication assistance  Rental assistance  Food pantry  Medication assistance  Housing assistance  Emergency food distribution  Utility assistance [place], [place]  [address] [place], [place] [address] Hours: Tuesdays and Thursdays from 9am - 12 noon by appointment only  [place], [place] [address]  [facility] [place] private insurance, [place], and  Medicaid.  Payment is based on a sliding scale for those without insurance.  Hours: Mondays, Tuesdays and Thursdays, 8:30 am - 5:30 pm.   [phone] [place], [place]  [facility] - Family Medicine at [facility], [place], and [place].  Payment is based on a sliding scale for those without insurance. [phone] [address] [place], [place]  [facility] - Pediatrics at [address], research (medical), [employer], and [place].  Payment is based on a sliding scale for those without insurance [phone] [address], [employer], [place]  [facility] - Pediatrics at  [employer], [place], and [place].  Payment is based on a sliding scale for those without insurance. [phone] [place], [place]  [facility] - Pediatrics at [facility], [place], and [place].  Payment is based on a sliding scale for those without insurance. [phone], ext. [phone] [place], [place].    [place] care.  Accepts Medicaid and self-pay. [place], [place]

## 2015-05-30 ENCOUNTER — Encounter (HOSPITAL_COMMUNITY): Payer: Self-pay | Admitting: Emergency Medicine

## 2015-05-30 ENCOUNTER — Emergency Department (INDEPENDENT_AMBULATORY_CARE_PROVIDER_SITE_OTHER)
Admission: EM | Admit: 2015-05-30 | Discharge: 2015-05-30 | Disposition: A | Discharge status: Home / Self Care | Payer: Self-pay | Source: Home / Self Care | Attending: Emergency Medicine | Admitting: Emergency Medicine

## 2015-05-30 DIAGNOSIS — H6502 Acute serous otitis media, left ear: Secondary | ICD-10-CM

## 2015-05-30 DIAGNOSIS — J9801 Acute bronchospasm: Secondary | ICD-10-CM

## 2015-05-30 DIAGNOSIS — J069 Acute upper respiratory infection, unspecified: Secondary | ICD-10-CM

## 2015-05-30 DIAGNOSIS — B349 Viral infection, unspecified: Secondary | ICD-10-CM

## 2015-05-30 MED ORDER — AMOXICILLIN 500 MG PO CAPS: 1000.0000 mg | ORAL_CAPSULE | Freq: Two times a day (BID) | ORAL | Status: DC

## 2015-05-30 MED ORDER — IPRATROPIUM-ALBUTEROL 0.5-2.5 (3) MG/3ML IN SOLN
RESPIRATORY_TRACT | Status: AC
  Filled 2015-05-30: qty 3

## 2015-05-30 MED ORDER — PREDNISONE 20 MG PO TABS: ORAL_TABLET | ORAL | Status: DC

## 2015-05-30 MED ORDER — ALBUTEROL SULFATE (2.5 MG/3ML) 0.083% IN NEBU
INHALATION_SOLUTION | RESPIRATORY_TRACT | Status: AC
  Filled 2015-05-30: qty 3

## 2015-05-30 MED ORDER — ALBUTEROL SULFATE (2.5 MG/3ML) 0.083% IN NEBU
2.5000 mg | INHALATION_SOLUTION | Freq: Once | RESPIRATORY_TRACT | Status: AC
  Administered 2015-05-30: 2.5 mg via RESPIRATORY_TRACT

## 2015-05-30 MED ORDER — ALBUTEROL SULFATE HFA 108 (90 BASE) MCG/ACT IN AERS: 2.0000 | INHALATION_SPRAY | RESPIRATORY_TRACT | Status: DC | PRN

## 2015-05-30 MED ORDER — TRIAMCINOLONE ACETONIDE 40 MG/ML IJ SUSP
INTRAMUSCULAR | Status: AC
  Filled 2015-05-30: qty 1

## 2015-05-30 MED ORDER — TRIAMCINOLONE ACETONIDE 40 MG/ML IJ SUSP
40.0000 mg | Freq: Once | INTRAMUSCULAR | Status: AC
Start: 2015-05-30 — End: 2015-05-30
  Administered 2015-05-30: 40 mg via INTRAMUSCULAR

## 2015-05-30 MED ORDER — SODIUM CHLORIDE 0.9 % IN NEBU
INHALATION_SOLUTION | RESPIRATORY_TRACT | Status: AC
  Filled 2015-05-30: qty 3

## 2015-05-30 MED ORDER — IPRATROPIUM-ALBUTEROL 0.5-2.5 (3) MG/3ML IN SOLN
3.0000 mL | Freq: Once | RESPIRATORY_TRACT | Status: AC
  Administered 2015-05-30: 3 mL via RESPIRATORY_TRACT

## 2015-05-30 NOTE — ED Provider Notes (Signed)
CSN: [id number]     Arrival date & time 05/30/15  94 History   First MD Initiated Contact with Patient 05/30/15 1815     Chief Complaint  Patient presents with  . Influenza   (Consider location/radiation/quality/duration/timing/severity/associated sxs/prior Treatment) HPI Comments: 40 year old male presents with persistent and unrelenting cough for a week. St left ear pain and feeling stopped, sinus pressure, nasal discharge, PND. He states he has been smoking for several years but stopped 2 weeks ago. No documented fever. Is also complaining of left anterior chest pain associated with coughing.   Past Medical History  Diagnosis Date  . GERD (gastroesophageal reflux disease)   . Bipolar 1 disorder ([place])   . Arthritis     DDD Lumbar  . Ulcer     associated with alcohol use  . Depression     followed by Candis Schatz; followed every six months to nine months.  . Alcoholism ([place])   . Anxiety   . HLD (hyperlipidemia)     now under control   Past Surgical History  Procedure Laterality Date  . Wisdom tooth extraction     Family History  Problem Relation Age of Onset  . Arthritis Father   . Lung cancer Other     MG aunt x 4  . Diabetes Father   . Prostate cancer Paternal Grandfather     or possibly colon cancer  . Asthma Mother   . COPD Mother   . Colon polyps Father    Social History  Substance Use Topics  . Smoking status: Former Smoker -- 0.25 packs/day    Types: Cigarettes    Quit date: 11/28/2013  . Smokeless tobacco: Never Used  . Alcohol Use: Yes     Comment: occasional     Review of Systems  Constitutional: Positive for fever and activity change. Negative for diaphoresis and fatigue.  HENT: Positive for congestion, ear pain, postnasal drip and sinus pressure. Negative for facial swelling, rhinorrhea, sore throat and trouble swallowing.   Eyes: Negative for pain, discharge and redness.  Respiratory: Positive for cough. Negative for chest tightness and shortness  of breath.   Cardiovascular: Negative.   Gastrointestinal: Negative.   Musculoskeletal: Negative.  Negative for neck pain and neck stiffness.  Skin: Negative.   Neurological: Negative.   All other systems reviewed and are negative.   Allergies  Tramadol  Home Medications   Prior to Admission medications   Medication Sig Start Date End Date Taking? Authorizing Provider  guaiFENesin (MUCINEX) 600 MG 12 hr tablet Take by mouth 2 (two) times daily.   Yes Historical Provider, MD  guaiFENesin (ROBITUSSIN) 100 MG/5ML liquid Take 200 mg by mouth 3 (three) times daily as needed for cough.   Yes Historical Provider, MD  Pseudoeph-Doxylamine-DM-APAP (NYQUIL PO) Take by mouth.   Yes Historical Provider, MD  acetaminophen (TYLENOL) 500 MG tablet Take 1,500 mg by mouth every 8 (eight) hours as needed for mild pain, moderate pain or headache.    Historical Provider, MD  albuterol (PROVENTIL HFA;VENTOLIN HFA) 108 (90 Base) MCG/ACT inhaler Inhale 2 puffs into the lungs every 4 (four) hours as needed for wheezing or shortness of breath. 05/30/15   Janne Napoleon, NP  amoxicillin (AMOXIL) 500 MG capsule Take 2 capsules (1,000 mg total) by mouth 2 (two) times daily. 05/30/15   Janne Napoleon, NP  cyclobenzaprine (FLEXERIL) 10 MG tablet Take 10 mg by mouth once.    Historical Provider, MD  meloxicam (MOBIC) 7.5 MG tablet Take 1 tablet (7.5  mg total) by mouth daily as needed for pain. 05/20/15   Clayton Bibles, PA-C  methocarbamol (ROBAXIN) 500 MG tablet Take 1-2 tablets (500-1,000 mg total) by mouth every 6 (six) hours as needed for muscle spasms (and pain). 05/20/15   Clayton Bibles, PA-C  predniSONE (DELTASONE) 20 MG tablet 3 Tabs PO Days 1-3, then 2 tabs PO Days 4-6, then 1 tab PO Day 7-9, then Half Tab PO Day 10-12 05/30/15   Janne Napoleon, NP   Meds Ordered and Administered this Visit   Medications  ipratropium-albuterol (DUONEB) 0.5-2.5 (3) MG/3ML nebulizer solution 3 mL (3 mLs Nebulization Given 05/30/15 1911)  albuterol  (PROVENTIL) (2.5 MG/3ML) 0.083% nebulizer solution 2.5 mg (2.5 mg Nebulization Given 05/30/15 1911)  triamcinolone acetonide (KENALOG-40) injection 40 mg (40 mg Intramuscular Given 05/30/15 1911)    BP 111/78 mmHg  Pulse 81  Temp(Src) 98.8 F (37.1 C) (Oral)  Resp 16  SpO2 96% No data found.   Physical Exam  Constitutional: He is oriented to person, place, and time. He appears well-developed and well-nourished. No distress.  HENT:  Mouth/Throat: No oropharyngeal exudate.  Right TM is normal. Left TM with erythema and bulging to the upper half. Oropharynx with erythema and moderate amount of clear PND.  Eyes: Conjunctivae and EOM are normal.  Neck: Normal range of motion. Neck supple.  Cardiovascular: Normal rate, regular rhythm and normal heart sounds.   Pulmonary/Chest: Effort normal. No respiratory distress. He has wheezes. He has no rales.  Prolonged expiratory phase. Bilateral coarseness and wheezing.  Musculoskeletal: Normal range of motion. He exhibits no edema.  Lymphadenopathy:    He has no cervical adenopathy.  Neurological: He is alert and oriented to person, place, and time.  Skin: Skin is warm and dry. No rash noted.  Psychiatric: He has a normal mood and affect.  Nursing note and vitals reviewed.   ED Course  Procedures (including critical care time)  Labs Review Labs Reviewed - No data to display  Imaging Review No results found.   Visual Acuity Review  Right Eye Distance:   Left Eye Distance:   Bilateral Distance:    Right Eye Near:   Left Eye Near:    Bilateral Near:         MDM   1. URI (upper respiratory infection)   2. Viral syndrome   3. Cough due to bronchospasm   4. Acute serous otitis media of left ear, recurrence not specified    Status post DuoNeb 2.5/5 mg there is improvement in air movement, decrease in cough and wheezing. Wheezing in the bilateral fields persist but improved. Patient states his breathing better. Stop  smoking. For nasal and head congestion may take Sudafed PE 10 mg every 4 hours as needed. Saline nasal spray used frequently. For drainage may use Allegra, Claritin or Zyrtec. If you need stronger medicine to stop drainage may take Chlor-Trimeton 2-4 mg every 4 hours. This may cause drowsiness. Ibuprofen 600 mg every 6 hours as needed for pain, discomfort or fever. Drink plenty of fluids and stay well-hydrated. Meds ordered this encounter  Medications  . guaiFENesin (MUCINEX) 600 MG 12 hr tablet    Sig: Take by mouth 2 (two) times daily.  . Pseudoeph-Doxylamine-DM-APAP (NYQUIL PO)    Sig: Take by mouth.  Marland Kitchen guaiFENesin (ROBITUSSIN) 100 MG/5ML liquid    Sig: Take 200 mg by mouth 3 (three) times daily as needed for cough.  Marland Kitchen ipratropium-albuterol (DUONEB) 0.5-2.5 (3) MG/3ML nebulizer solution 3 mL  Sig:   . albuterol (PROVENTIL) (2.5 MG/3ML) 0.083% nebulizer solution 2.5 mg    Sig:   . triamcinolone acetonide (KENALOG-40) injection 40 mg    Sig:   . albuterol (PROVENTIL HFA;VENTOLIN HFA) 108 (90 Base) MCG/ACT inhaler    Sig: Inhale 2 puffs into the lungs every 4 (four) hours as needed for wheezing or shortness of breath.    Dispense:  1 Inhaler    Refill:  0    Order Specific Question:  Supervising Provider    Answer:  Melony Overly [id number]  . predniSONE (DELTASONE) 20 MG tablet    Sig: 3 Tabs PO Days 1-3, then 2 tabs PO Days 4-6, then 1 tab PO Day 7-9, then Half Tab PO Day 10-12    Dispense:  20 tablet    Refill:  0    Order Specific Question:  Supervising Provider    Answer:  Melony Overly [id number]  . amoxicillin (AMOXIL) 500 MG capsule    Sig: Take 2 capsules (1,000 mg total) by mouth 2 (two) times daily.    Dispense:  40 capsule    Refill:  0    Order Specific Question:  Supervising Provider    Answer:  Carmela Hurt       Janne Napoleon, NP 05/30/15 [phone]

## 2015-05-30 NOTE — ED Notes (Signed)
Onset 3/3, cough, left ear fullness, sinus/forehead pressure, unknown fever, but has had chills, sweaty episodes

## 2015-05-30 NOTE — Discharge Instructions (Signed)
Upper Respiratory Infection, Adult For nasal and head congestion may take Sudafed PE 10 mg every 4 hours as needed. Saline nasal spray used frequently. For drainage may use Allegra, Claritin or Zyrtec. If you need stronger medicine to stop drainage may take Chlor-Trimeton 2-4 mg every 4 hours. This may cause drowsiness. Ibuprofen 600 mg every 6 hours as needed for pain, discomfort or fever. Or Tylenol. Drink plenty of fluids and stay well-hydrated.  Most upper respiratory infections (URIs) are a viral infection of the air passages leading to the lungs. A URI affects the nose, throat, and upper air passages. The most common type of URI is nasopharyngitis and is typically referred to as "the common cold." URIs run their course and usually go away on their own. Most of the time, a URI does not require medical attention, but sometimes a bacterial infection in the upper airways can follow a viral infection. This is called a secondary infection. Sinus and middle ear infections are common types of secondary upper respiratory infections. Bacterial pneumonia can also complicate a URI. A URI can worsen asthma and chronic obstructive pulmonary disease (COPD). Sometimes, these complications can require emergency medical care and may be life threatening.  CAUSES Almost all URIs are caused by viruses. A virus is a type of germ and can spread from one person to another.  RISKS FACTORS You may be at risk for a URI if:   You smoke.   You have chronic heart or lung disease.  You have a weakened defense (immune) system.   You are very young or very old.   You have nasal allergies or asthma.  You work in crowded or poorly ventilated areas.  You work in health care facilities or schools. SIGNS AND SYMPTOMS  Symptoms typically develop 2-3 days after you come in contact with a cold virus. Most viral URIs last 7-10 days. However, viral URIs from the influenza virus (flu virus) can last 14-18 days and are  typically more severe. Symptoms may include:   Runny or stuffy (congested) nose.   Sneezing.   Cough.   Sore throat.   Headache.   Fatigue.   Fever.   Loss of appetite.   Pain in your forehead, behind your eyes, and over your cheekbones (sinus pain).  Muscle aches.  DIAGNOSIS  Your health care provider may diagnose a URI by:  Physical exam.  Tests to check that your symptoms are not due to another condition such as:  Strep throat.  Sinusitis.  Pneumonia.  Asthma. TREATMENT  A URI goes away on its own with time. It cannot be cured with medicines, but medicines may be prescribed or recommended to relieve symptoms. Medicines may help:  Reduce your fever.  Reduce your cough.  Relieve nasal congestion. HOME CARE INSTRUCTIONS   Take medicines only as directed by your health care provider.   Gargle warm saltwater or take cough drops to comfort your throat as directed by your health care provider.  Use a warm mist humidifier or inhale steam from a shower to increase air moisture. This may make it easier to breathe.  Drink enough fluid to keep your urine clear or pale yellow.   Eat soups and other clear broths and maintain good nutrition.   Rest as needed.   Return to work when your temperature has returned to normal or as your health care provider advises. You may need to stay home longer to avoid infecting others. You can also use a face mask and careful  hand washing to prevent spread of the virus.  Increase the usage of your inhaler if you have asthma.   Do not use any tobacco products, including cigarettes, chewing tobacco, or electronic cigarettes. If you need help quitting, ask your health care provider. PREVENTION  The best way to protect yourself from getting a cold is to practice good hygiene.   Avoid oral or hand contact with people with cold symptoms.   Wash your hands often if contact occurs.  There is no clear evidence that  vitamin C, vitamin E, echinacea, or exercise reduces the chance of developing a cold. However, it is always recommended to get plenty of rest, exercise, and practice good nutrition.  SEEK MEDICAL CARE IF:   You are getting worse rather than better.   Your symptoms are not controlled by medicine.   You have chills.  You have worsening shortness of breath.  You have brown or red mucus.  You have yellow or brown nasal discharge.  You have pain in your face, especially when you bend forward.  You have a fever.  You have swollen neck glands.  You have pain while swallowing.  You have white areas in the back of your throat. SEEK IMMEDIATE MEDICAL CARE IF:   You have severe or persistent:  Headache.  Ear pain.  Sinus pain.  Chest pain.  You have chronic lung disease and any of the following:  Wheezing.  Prolonged cough.  Coughing up blood.  A change in your usual mucus.  You have a stiff neck.  You have changes in your:  Vision.  Hearing.  Thinking.  Mood. MAKE SURE YOU:   Understand these instructions.  Will watch your condition.  Will get help right away if you are not doing well or get worse.   This information is not intended to replace advice given to you by your health care provider. Make sure you discuss any questions you have with your health care provider.   Document Released: 09/02/2000 Document Revised: 07/24/2014 Document Reviewed: 06/14/2013 Elsevier Interactive Patient Education 2016 Elsevier Inc[employer]  Bronchospasm, Adult A bronchospasm is a spasm or tightening of the airways going into the lungs. During a bronchospasm breathing becomes more difficult because the airways get smaller. When this happens there can be coughing, a whistling sound when breathing (wheezing), and difficulty breathing. Bronchospasm is often associated with asthma, but not all patients who experience a bronchospasm have asthma. CAUSES  A bronchospasm is caused by  inflammation or irritation of the airways. The inflammation or irritation may be triggered by:   Allergies (such as to animals, pollen, food, or mold). Allergens that cause bronchospasm may cause wheezing immediately after exposure or many hours later.   Infection. Viral infections are believed to be the most common cause of bronchospasm.   Exercise.   Irritants (such as pollution, cigarette smoke, strong odors, aerosol sprays, and paint fumes).   Weather changes. Winds increase molds and pollens in the air. Rain refreshes the air by washing irritants out. Cold air may cause inflammation.   Stress and emotional upset.  SIGNS AND SYMPTOMS   Wheezing.   Excessive nighttime coughing.   Frequent or severe coughing with a simple cold.   Chest tightness.   Shortness of breath.  DIAGNOSIS  Bronchospasm is usually diagnosed through a history and physical exam. Tests, such as chest X-rays, are sometimes done to look for other conditions. TREATMENT   Inhaled medicines can be given to open up your airways and help  you breathe. The medicines can be given using either an inhaler or a nebulizer machine.  Corticosteroid medicines may be given for severe bronchospasm, usually when it is associated with asthma. HOME CARE INSTRUCTIONS   Always have a plan prepared for seeking medical care. Know when to call your health care provider and local emergency services (911 in the [place] Know where you can access local emergency care.  Only take medicines as directed by your health care provider.  If you were prescribed an inhaler or nebulizer machine, ask your health care provider to explain how to use it correctly. Always use a spacer with your inhaler if you were given one.  It is necessary to remain calm during an attack. Try to relax and breathe more slowly.  Control your home environment in the following ways:   Change your heating and air conditioning filter at least once a  month.   Limit your use of fireplaces and wood stoves.  Do not smoke and do not allow smoking in your home.   Avoid exposure to perfumes and fragrances.   Get rid of pests (such as roaches and mice) and their droppings.   Throw away plants if you see mold on them.   Keep your house clean and dust free.   Replace carpet with wood, tile, or vinyl flooring. Carpet can trap dander and dust.   Use allergy-proof pillows, mattress covers, and box spring covers.   Wash bed sheets and blankets every week in hot water and dry them in a dryer.   Use blankets that are made of polyester or cotton.   Wash hands frequently. SEEK MEDICAL CARE IF:   You have muscle aches.   You have chest pain.   The sputum changes from clear or white to yellow, green, gray, or bloody.   The sputum you cough up gets thicker.   There are problems that may be related to the medicine you are given, such as a rash, itching, swelling, or trouble breathing.  SEEK IMMEDIATE MEDICAL CARE IF:   You have worsening wheezing and coughing even after taking your prescribed medicines.   You have increased difficulty breathing.   You develop severe chest pain. MAKE SURE YOU:   Understand these instructions.  Will watch your condition.  Will get help right away if you are not doing well or get worse.   This information is not intended to replace advice given to you by your health care provider. Make sure you discuss any questions you have with your health care provider.   Document Released: 03/12/2003 Document Revised: 03/30/2014 Document Reviewed: 08/29/2012 Elsevier Interactive Patient Education 2016 Elsevier Inc.  Cough, Adult Coughing is a reflex that clears your throat and your airways. Coughing helps to heal and protect your lungs. It is normal to cough occasionally, but a cough that happens with other symptoms or lasts a long time may be a sign of a condition that needs treatment. A  cough may last only 2-3 weeks (acute), or it may last longer than 8 weeks (chronic). CAUSES Coughing is commonly caused by:  Breathing in substances that irritate your lungs.  A viral or bacterial respiratory infection.  Allergies.  Asthma.  Postnasal drip.  Smoking.  Acid backing up from the stomach into the esophagus (gastroesophageal reflux).  Certain medicines.  Chronic lung problems, including COPD (or rarely, lung cancer).  Other medical conditions such as heart failure. HOME CARE INSTRUCTIONS  Pay attention to any changes in your symptoms. Take  these actions to help with your discomfort:  Take medicines only as told by your health care provider.  If you were prescribed an antibiotic medicine, take it as told by your health care provider. Do not stop taking the antibiotic even if you start to feel better.  Talk with your health care provider before you take a cough suppressant medicine.  Drink enough fluid to keep your urine clear or pale yellow.  If the air is dry, use a cold steam vaporizer or humidifier in your bedroom or your home to help loosen secretions.  Avoid anything that causes you to cough at work or at home.  If your cough is worse at night, try sleeping in a semi-upright position.  Avoid cigarette smoke. If you smoke, quit smoking. If you need help quitting, ask your health care provider.  Avoid caffeine.  Avoid alcohol.  Rest as needed. SEEK MEDICAL CARE IF:   You have new symptoms.  You cough up pus.  Your cough does not get better after 2-3 weeks, or your cough gets worse.  You cannot control your cough with suppressant medicines and you are losing sleep.  You develop pain that is getting worse or pain that is not controlled with pain medicines.  You have a fever.  You have unexplained weight loss.  You have night sweats. SEEK IMMEDIATE MEDICAL CARE IF:  You cough up blood.  You have difficulty breathing.  Your heartbeat is  very fast.   This information is not intended to replace advice given to you by your health care provider. Make sure you discuss any questions you have with your health care provider.   Document Released: 09/05/2010 Document Revised: 11/28/2014 Document Reviewed: 05/16/2014 Elsevier Interactive Patient Education 2016 [employer].  How to Use an Inhaler Using your inhaler correctly is very important. Good technique will make sure that the medicine reaches your lungs.  HOW TO USE AN INHALER:  Take the cap off the inhaler.  If this is the first time using your inhaler, you need to prime it. Shake the inhaler for 5 seconds. Release four puffs into the air, away from your face. Ask your doctor for help if you have questions.  Shake the inhaler for 5 seconds.  Turn the inhaler so the bottle is above the mouthpiece.  Put your pointer finger on top of the bottle. Your thumb holds the bottom of the inhaler.  Open your mouth.  Either hold the inhaler away from your mouth (the width of 2 fingers) or place your lips tightly around the mouthpiece. Ask your doctor which way to use your inhaler.  Breathe out as much air as possible.  Breathe in and push down on the bottle 1 time to release the medicine. You will feel the medicine go in your mouth and throat.  Continue to take a deep breath in very slowly. Try to fill your lungs.  After you have breathed in completely, hold your breath for 10 seconds. This will help the medicine to settle in your lungs. If you cannot hold your breath for 10 seconds, hold it for as long as you can before you breathe out.  Breathe out slowly, through pursed lips. Whistling is an example of pursed lips.  If your doctor has told you to take more than 1 puff, wait at least 15-30 seconds between puffs. This will help you get the best results from your medicine. Do not use the inhaler more than your doctor tells you to.  Put the cap back on the inhaler.  Follow the  directions from your doctor or from the inhaler package about cleaning the inhaler. If you use more than one inhaler, ask your doctor which inhalers to use and what order to use them in. Ask your doctor to help you figure out when you will need to refill your inhaler.  If you use a steroid inhaler, always rinse your mouth with water after your last puff, gargle and spit out the water. Do not swallow the water. GET HELP IF:  The inhaler medicine only partially helps to stop wheezing or shortness of breath.  You are having trouble using your inhaler.  You have some increase in thick spit (phlegm). GET HELP RIGHT AWAY IF:  The inhaler medicine does not help your wheezing or shortness of breath or you have tightness in your chest.  You have dizziness, headaches, or fast heart rate.  You have chills, fever, or night sweats.  You have a large increase of thick spit, or your thick spit is bloody. MAKE SURE YOU:   Understand these instructions.  Will watch your condition.  Will get help right away if you are not doing well or get worse.   This information is not intended to replace advice given to you by your health care provider. Make sure you discuss any questions you have with your health care provider.   Document Released: 12/17/2007 Document Revised: 12/28/2012 Document Reviewed: 10/06/2012 Elsevier Interactive Patient Education 2016 [employer].  Otitis Media, Adult Otitis media is redness, soreness, and puffiness (swelling) in the space just behind your eardrum (middle ear). It may be caused by allergies or infection. It often happens along with a cold. HOME CARE  Take your medicine as told. Finish it even if you start to feel better.  Only take over-the-counter or prescription medicines for pain, discomfort, or fever as told by your doctor.  Follow up with your doctor as told. GET HELP IF:  You have otitis media only in one ear, or bleeding from your nose, or both.  You  notice a lump on your neck.  You are not getting better in 3-5 days.  You feel worse instead of better. GET HELP RIGHT AWAY IF:   You have pain that is not helped with medicine.  You have puffiness, redness, or pain around your ear.  You get a stiff neck.  You cannot move part of your face (paralysis).  You notice that the bone behind your ear hurts when you touch it. MAKE SURE YOU:   Understand these instructions.  Will watch your condition.  Will get help right away if you are not doing well or get worse.   This information is not intended to replace advice given to you by your health care provider. Make sure you discuss any questions you have with your health care provider.   Document Released: 08/26/2007 Document Revised: 03/30/2014 Document Reviewed: 10/04/2012 Elsevier Interactive Patient Education [employer].

## 2016-08-31 ENCOUNTER — Encounter: Payer: Self-pay | Admitting: *Deleted

## 2016-09-16 ENCOUNTER — Encounter: Payer: Self-pay | Admitting: Internal Medicine

## 2017-12-01 ENCOUNTER — Ambulatory Visit (HOSPITAL_COMMUNITY)
Admission: EM | Admit: 2017-12-01 | Discharge: 2017-12-01 | Disposition: A | Discharge status: Home / Self Care | Payer: Self-pay | Attending: Family Medicine | Admitting: Family Medicine

## 2017-12-01 ENCOUNTER — Encounter (HOSPITAL_COMMUNITY): Payer: Self-pay

## 2017-12-01 ENCOUNTER — Other Ambulatory Visit: Payer: Self-pay

## 2017-12-01 DIAGNOSIS — K529 Noninfective gastroenteritis and colitis, unspecified: Secondary | ICD-10-CM

## 2017-12-01 MED ORDER — ONDANSETRON 4 MG PO TBDP: 4.0000 mg | ORAL_TABLET | Freq: Three times a day (TID) | ORAL | 0 refills | Status: DC | PRN

## 2017-12-01 NOTE — Discharge Instructions (Addendum)

## 2017-12-01 NOTE — ED Triage Notes (Signed)
Pt states he has stomach pain and vomiting X 3 days.

## 2017-12-10 NOTE — ED Provider Notes (Signed)
[place]   [id number] 12/01/17 Arrival Time: [place] PLAN:  1. Gastroenteritis     Meds ordered this encounter  Medications  . ondansetron (ZOFRAN-ODT) 4 MG disintegrating tablet    Sig: Take 1 tablet (4 mg total) by mouth every 8 (eight) hours as needed for nausea or vomiting.    Dispense:  15 tablet    Refill:  0   Discussed typical duration of symptoms for suspected viral GI illness. Will do his best to ensure adequate fluid intake in order to avoid dehydration. Will proceed to the Emergency Department for evaluation if unable to tolerate PO fluids regularly.  Otherwise he will f/u with his PCP or here if not showing improvement over the next 48-72 hours.  Reviewed expectations re: course of current medical issues. Questions answered. Outlined signs and symptoms indicating need for more acute intervention. Patient verbalized understanding. After Visit Summary given.   SUBJECTIVE: History from: patient.  Dean Palmer is a 42 y.o. male who presents with complaint of non-bloody intermittent nausea and vomiting of undigested food with a few loose stools. Onset fairly abrupt over the past 2-3 days.. Abdominal discomfort: mild and cramping. Symptoms are unchanged since beginning. Aggravating factors: eating. Alleviating factors: none. Associated symptoms: fatigue. He denies fever. Appetite: decreased. PO intake: decreased. Ambulatory without assistance. Urinary symptoms: none. Last bowel movement today without blood. OTC treatment: none reported.  Past Surgical History:  Procedure Laterality Date  . WISDOM TOOTH EXTRACTION     ROS: As per HPI. All other systems negative.  OBJECTIVE:  Vitals:   12/01/17 1801 12/01/17 1802  BP: 120/80   Pulse: 99   Resp: 18   Temp: 98.2 F (36.8 C)   SpO2: 100%   Weight:  79.8 kg    General appearance: alert; no distress Oropharynx: moist Lungs: clear to auscultation bilaterally Heart: regular rate and  rhythm Abdomen: soft; non-distended; no significant abdominal tenderness described as cramping; bowel sounds present; no masses or organomegaly; no guarding or rebound tenderness Back: no CVA tenderness Extremities: no edema; symmetrical with no gross deformities Skin: warm and dry Neurologic: normal gait Psychological: alert and cooperative; normal mood and affect  Allergies  Allergen Reactions  . Tramadol Nausea Only                                               Past Medical History:  Diagnosis Date  . Alcoholism ([place])   . Anxiety   . Arthritis    DDD Lumbar  . Bipolar 1 disorder ([place])   . Depression    followed by Candis Schatz; followed every six months to nine months.  Marland Kitchen GERD (gastroesophageal reflux disease)   . HLD (hyperlipidemia)    now under control  . Ulcer    associated with alcohol use   Social History   Socioeconomic History  . Marital status: Married    Spouse name: Not on file  . Number of children: 1  . Years of education: Not on file  . Highest education level: Not on file  Occupational History  . Occupation: apprentice//electrician    Employer: [employer] [place]    Employer: [place]  Social Needs  . Financial resource strain: Not on file  . Food insecurity:    Worry: Not on file    Inability: Not on file  . Transportation  needs:    Medical: Not on file    Non-medical: Not on file  Tobacco Use  . Smoking status: Former Smoker    Packs/day: 0.25    Types: Cigarettes    Last attempt to quit: 11/28/2013    Years since quitting: 4.0  . Smokeless tobacco: Never Used  Substance and Sexual Activity  . Alcohol use: Yes    Comment: occasional   . Drug use: Yes    Types: Marijuana  . Sexual activity: Yes  Lifestyle  . Physical activity:    Days per week: Not on file    Minutes per session: Not on file  . Stress: Not on file  Relationships  . Social connections:    Talks on phone: Not on file    Gets together: Not on  file    Attends religious service: Not on file    Active member of club or organization: Not on file    Attends meetings of clubs or organizations: Not on file    Relationship status: Not on file  . Intimate partner violence:    Fear of current or ex partner: Not on file    Emotionally abused: Not on file    Physically abused: Not on file    Forced sexual activity: Not on file  Other Topics Concern  . Not on file  Social History Narrative   Marital status: married x 6 years; happily married.      Children: 1 son (53 yo)      Lives: with wife, son, mother-in-law.      Employment:  Clinical biochemist x 2000; moderately happy.      Tobacco:  Quit 03/2012; smoked x 15 years.  Quit 2004-2007.       Alcohol:  Rarely now; previous heavier use.      Drugs: none; previous marijuana years ago.       Exercise:  None      Seatbelt: 100%      Sunscreen: SPF 15      Guns: none   Family History  Problem Relation Age of Onset  . Arthritis Father   . Diabetes Father   . Colon polyps Father   . Prostate cancer Paternal Grandfather        or possibly colon cancer  . Asthma Mother   . COPD Mother   . Lung cancer Other        MG aunt x 4     Vanessa Kick, MD 12/10/17 1850

## 2018-01-18 ENCOUNTER — Ambulatory Visit (HOSPITAL_COMMUNITY)
Admission: EM | Admit: 2018-01-18 | Discharge: 2018-01-18 | Disposition: A | Discharge status: Home / Self Care | Payer: Self-pay | Attending: Family Medicine | Admitting: Family Medicine

## 2018-01-18 ENCOUNTER — Encounter (HOSPITAL_COMMUNITY): Payer: Self-pay | Admitting: Emergency Medicine

## 2018-01-18 DIAGNOSIS — M545 Low back pain, unspecified: Secondary | ICD-10-CM

## 2018-01-18 NOTE — ED Notes (Signed)
Pt discharged by provider.

## 2018-01-18 NOTE — ED Triage Notes (Signed)
PT hurt his back Thursday morning. Pt coughed while he was leaning over and threw out his back. PT would like a return to work note

## 2018-01-18 NOTE — Discharge Instructions (Signed)
Use anti-inflammatories for pain/swelling. You may take up to 800 mg Ibuprofen every 8 hours with food. You may supplement Ibuprofen with Tylenol [phone] mg every 8 hours.   You may use flexeril as needed to help with pain. This is a muscle relaxer and causes sedation- please use only at bedtime or when you will be home and not have to drive/work  Alternate Ice and heat Use good body mechanics with lifting  Follow up if worsening, developing weakness, numbness, tingling, loss of bowel or bladder control

## 2018-01-19 NOTE — ED Provider Notes (Signed)
[place]    CSN: [id number] Arrival date & time: 01/18/18  1755     History   Chief Complaint Chief Complaint  Patient presents with  . Back Pain    HPI PIO Dean Palmer is a 42 y.o. male history of lumbar region degenerative disc disease, depression, hyperlipidemia presenting today for evaluation of lower back pain.  Patient states that Thursday morning as he was driving to work he was turning his car, began to cough and threw his back out.  States that he went to work that day, but the following day when he woke up he had significant pain and difficulty moving.  States that it felt very similar to previous times he has strained his back.  He took Flexeril and ibuprofen for previous visits, over the weekend his symptoms improved.  He was out of work Friday and Monday and today, requesting work note to return.  He denies numbness or tingling.  Denies changes in bowel or bladder habits or control.  Denies saddle anesthesia.  Denies weakness in legs.  Overall he is back to normal his normal function.  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  HPI  Past Medical History:  Diagnosis Date  . Alcoholism ([place])   . Anxiety   . Arthritis    DDD Lumbar  . Bipolar 1 disorder ([place])   . Depression    followed by Candis Schatz; followed every six months to nine months.  Marland Kitchen GERD (gastroesophageal reflux disease)   . HLD (hyperlipidemia)    now under control  . Ulcer    associated with alcohol use    Patient Active Problem List   Diagnosis Date Noted  . Oral infection 12/27/2013  . Nausea with vomiting 12/01/2013  . Abdominal pain 07/21/2013  . Acute bronchitis 08/09/2012  . Viral URI with cough 08/05/2012  . Bipolar disorder ([place]) 04/12/2012  . Flu 04/08/2011  . UNSPECIFIED VITAMIN D DEFICIENCY 06/10/2009  . BACK PAIN WITH RADICULOPATHY 05/31/2009  . FATIGUE 05/31/2009  . NUMBNESS, HAND 05/31/2009  . SNORING  05/31/2009    Past Surgical History:  Procedure Laterality Date  . WISDOM TOOTH EXTRACTION         Home Medications    Prior to Admission medications   Medication Sig Start Date End Date Taking? Authorizing Provider  cyclobenzaprine (FLEXERIL) 10 MG tablet Take 10 mg by mouth once.   Yes [provider]  guaiFENesin (ROBITUSSIN) 100 MG/5ML liquid Take 200 mg by mouth 3 (three) times daily as needed for cough.    [provider]    Family History Family History  Problem Relation Age of Onset  . Arthritis Father   . Diabetes Father   . Colon polyps Father   . Prostate cancer Paternal Grandfather        or possibly colon cancer  . Asthma Mother   . COPD Mother   . Lung cancer Other        MG aunt x 4    Social History Social History   Tobacco Use  . Smoking status: Former Smoker    Packs/day: 0.25    Types: Cigarettes    Last attempt to quit: 11/28/2013    Years since quitting: 4.1  . Smokeless tobacco: Never Used  Substance Use Topics  . Alcohol use: Yes    Comment: occasional   . Drug use: Yes    Types: Marijuana  Allergies   Tramadol   Review of Systems Review of Systems  Constitutional: Negative for fatigue and fever.  Eyes: Negative for redness, itching and visual disturbance.  Respiratory: Negative for shortness of breath.   Cardiovascular: Negative for chest pain and leg swelling.  Gastrointestinal: Negative for nausea and vomiting.  Genitourinary: Negative for decreased urine volume and difficulty urinating.  Musculoskeletal: Positive for back pain and myalgias. Negative for arthralgias, gait problem, neck pain and neck stiffness.  Skin: Negative for color change, rash and wound.  Neurological: Negative for dizziness, syncope, weakness, light-headedness, numbness and headaches.     Physical Exam Triage Vital Signs ED Triage Vitals [01/18/18 1848]  Enc Vitals Group     BP 137/84     Pulse Rate 79     Resp 16     Temp  98.8 F (37.1 C)     Temp Source Oral     SpO2 98 %     Weight      Height      Head Circumference      Peak Flow      Pain Score 1     Pain Loc      Pain Edu?      Excl. in [place]?    No data found.  Updated Vital Signs BP 137/84   Pulse 79   Temp 98.8 F (37.1 C) (Oral)   Resp 16   SpO2 98%   Visual Acuity Right Eye Distance:   Left Eye Distance:   Bilateral Distance:    Right Eye Near:   Left Eye Near:    Bilateral Near:     Physical Exam  Constitutional: He is oriented to person, place, and time. He appears well-developed and well-nourished.  No acute distress  HENT:  Head: Normocephalic and atraumatic.  Nose: Nose normal.  Eyes: Conjunctivae are normal.  Neck: Neck supple.  Cardiovascular: Normal rate.  Pulmonary/Chest: Effort normal. No respiratory distress.  Abdominal: He exhibits no distension.  Musculoskeletal: Normal range of motion.  Nontender to palpation of cervical and thoracic spine midline, minimal tenderness to palpation of paraspinal lumbar area, negative straight leg raise bilaterally, patient ambulating in room with ease, no abnormality.  Strength at hips and knees 5/5 and equal bilaterally, patellar reflex 2+ bilaterally  Neurological: He is alert and oriented to person, place, and time.  Skin: Skin is warm and dry.  Psychiatric: He has a normal mood and affect.  Nursing note and vitals reviewed.    UC Treatments / Results  Labs (all labs ordered are listed, but only abnormal results are displayed) Labs Reviewed - No data to display  EKG None  Radiology No results found.  Procedures Procedures (including critical care time)  Medications Ordered in UC Medications - No data to display  Initial Impression / Assessment and Plan / UC Course  I have reviewed the triage vital signs and the nursing notes.  Pertinent labs & imaging results that were available during my care of the patient were reviewed by me and considered in my  medical decision making (see chart for details).     Patient with lower back pain.  Advised him to continue to take ibuprofen and Flexeril at bedtime as needed.  Patient may return to work, but advised to continue to avoid heavy lifting temporarily.  No red flags for cauda equina.Discussed strict return precautions. Patient verbalized understanding and is agreeable with plan.  Final Clinical Impressions(s) / UC Diagnoses   Final diagnoses:  Acute midline low back pain without sciatica     Discharge Instructions     Use anti-inflammatories for pain/swelling. You may take up to 800 mg Ibuprofen every 8 hours with food. You may supplement Ibuprofen with Tylenol [phone] mg every 8 hours.   You may use flexeril as needed to help with pain. This is a muscle relaxer and causes sedation- please use only at bedtime or when you will be home and not have to drive/work  Alternate Ice and heat Use good body mechanics with lifting  Follow up if worsening, developing weakness, numbness, tingling, loss of bowel or bladder control   ED Prescriptions    None     Controlled Substance Prescriptions [place] Controlled Substance Registry consulted? Not Applicable   Janith Lima, [place] 01/19/18 1003

## 2018-01-21 ENCOUNTER — Other Ambulatory Visit: Payer: Self-pay

## 2018-01-21 ENCOUNTER — Ambulatory Visit (HOSPITAL_COMMUNITY)
Admission: EM | Admit: 2018-01-21 | Discharge: 2018-01-21 | Disposition: A | Discharge status: Home / Self Care | Payer: Self-pay | Attending: Emergency Medicine | Admitting: Emergency Medicine

## 2018-01-21 ENCOUNTER — Encounter (HOSPITAL_COMMUNITY): Payer: Self-pay | Admitting: Emergency Medicine

## 2018-01-21 DIAGNOSIS — G8929 Other chronic pain: Secondary | ICD-10-CM

## 2018-01-21 DIAGNOSIS — M545 Low back pain: Secondary | ICD-10-CM

## 2018-01-21 MED ORDER — METHOCARBAMOL 750 MG PO TABS: 1500.0000 mg | ORAL_TABLET | Freq: Three times a day (TID) | ORAL | 0 refills | Status: AC

## 2018-01-21 MED ORDER — IBUPROFEN 600 MG PO TABS: 600.0000 mg | ORAL_TABLET | Freq: Four times a day (QID) | ORAL | 0 refills | Status: AC | PRN

## 2018-01-21 MED ORDER — METHYLPREDNISOLONE 4 MG PO TBPK: ORAL_TABLET | Freq: Every day | ORAL | 0 refills | Status: DC

## 2018-01-21 NOTE — Discharge Instructions (Addendum)
600 mg of ibuprofen combined with 1 g, or 2 extra strength Tylenols 3 or 4 times a day as needed for pain.  The Robaxin will help with any muscle spasms and the Medrol Dosepak will help with inflammation in your back.  Follow-up with 1 of the primary care physicians listed below.  You may return to work on Monday if you are feeling better.  I would continue limiting lifting and other repetitive motions involving your back.  Below is a list of primary care practices who are taking new patients for you to follow-up with. [facility] [place], [place] [address] [phone]  [facility] [phone] [place] [address]  [facility]: [place]  [phone]  [facility]: [place]   [phone]  [facility]: [address] [place]  [phone]  [place] [facility] : [address] [place] [phone]  [facility]: [address] [place] [address] [phone]  [facility]: [place] [phone]  Dr. Blanchie Serve [place]  [phone]  Dr. Benito Mccreedy, [facility]. [place], [address] [address]  [phone]  Go to www.goodrx.com to look up your medications. This will give you a list of where you can find your prescriptions at the most affordable prices. Or ask the pharmacist what the cash price is, or if they have any other discount programs available to help make your medication more affordable. This can be less expensive than what you would pay with insurance.      Marland Kitchen

## 2018-01-21 NOTE — ED Triage Notes (Signed)
Patient seen on Tuesday for back pain "missed more work...had to come back for another note, ..same ole" routine"  Patient reports lower back pain, slowly improving.

## 2018-01-21 NOTE — ED Provider Notes (Signed)
HPI  SUBJECTIVE:  Dean Palmer is a 42 y.o. male who presents with persistent midline, dull low back pain in the L5-S1 area.  States that he threw it out 8 days ago while coughing.  Reports occasional sharp pains going down his left medial thigh but denies sciatica.  States that he is slowly and steadily getting better.  Denies N/V, fevers, flank pain, abdominal pain, urinary complaints.  No syncope. No saddle anesthesia, distal weakness/numbness, bilateral radicular leg pain/weakness, fevers/night sweats,recent h/o trauma, neurological deficits,  bladder/ bowel incontinence, urinary retention, h/o CA / multiple myleoma, unexplained weight loss, pain worse at night,  h/o prolonged steroid use, h/o osteopenia, h/o IVDU, h/o HIV,  known AAA.  States feels identical to similar to previous episodes of back pain.   Tried IBU 800 alternating with tylenol 1000 mg q q 4-6 hr. Also flexeril, helping a little bit.   Needs work note for today and Monday, has the weekend off.   Patient has been seen here since 2013 for several exacerbations of chronic low back pain.  Most recent visit was 3 days ago, advised to take as of 800 mg of ibuprofen with Tylenol 3 times a day, and Flexeril at bedtime.  Was told he could return to work but advised to avoid heavy lifting temporarily.  Past medical history of degenerative disc disease, no history of diabetes, hypertension.  PMD: None.  Past Medical History:  Diagnosis Date  . Alcoholism ([place])   . Anxiety   . Arthritis    DDD Lumbar  . Bipolar 1 disorder ([place])   . Depression    followed by Candis Schatz; followed every six months to nine months.  Marland Kitchen GERD (gastroesophageal reflux disease)   . HLD (hyperlipidemia)    now under control  . Ulcer    associated with alcohol use    Past Surgical History:  Procedure Laterality Date  . WISDOM TOOTH EXTRACTION      Family History  Problem Relation Age of Onset  . Arthritis Father   . Diabetes Father   . Colon  polyps Father   . Prostate cancer Paternal Grandfather        or possibly colon cancer  . Asthma Mother   . COPD Mother   . Lung cancer Other        MG aunt x 4    Social History   Tobacco Use  . Smoking status: Former Smoker    Packs/day: 0.25    Types: Cigarettes    Last attempt to quit: 11/28/2013    Years since quitting: 4.1  . Smokeless tobacco: Never Used  Substance Use Topics  . Alcohol use: Yes    Comment: occasional   . Drug use: Yes    Types: Marijuana    No current facility-administered medications for this encounter.   Current Outpatient Medications:  .  ibuprofen (ADVIL,MOTRIN) 600 MG tablet, Take 1 tablet (600 mg total) by mouth every 6 (six) hours as needed., Disp: 30 tablet, Rfl: 0 .  methocarbamol (ROBAXIN) 750 MG tablet, Take 2 tablets (1,500 mg total) by mouth 3 (three) times daily for 5 days., Disp: 30 tablet, Rfl: 0 .  methylPREDNISolone (MEDROL DOSEPAK) 4 MG TBPK tablet, Take by mouth daily. Follow package instructions, Disp: 21 tablet, Rfl: 0  Allergies  Allergen Reactions  . Tramadol Nausea Only     ROS  As noted in HPI.   Physical Exam  BP 118/70 (BP Location: Left Arm)   Pulse 77  Temp 98.4 F (36.9 C) (Oral)   Resp 16   SpO2 99%   Constitutional: Well developed, well nourished, no acute distress Eyes:  EOMI, conjunctiva normal bilaterally HENT: Normocephalic, atraumatic,mucus membranes moist Respiratory: Normal inspiratory effort Cardiovascular: Normal rate GI: nondistended. No suprapubic tenderness skin: No rash, skin intact Musculoskeletal: no CVAT. - paralumbar tenderness, -appreciable muscle spasm.  Positive bony tenderness at L5, S1.  Patient states that this is where he is always tender.. Bilateral lower extremities nontender, baseline ROM with intact DP pulses,  No pain with int/ext rotation flex/extension hips bilaterally. SLR neg bilaterally. Sensation baseline light touch bilaterally for Pt, DTR's symmetric and intact  bilaterally KJ, Motor symmetric bilateral 5/5 hip flexion, quadriceps, hamstrings, EHL, foot dorsiflexion, foot plantarflexion, gait normal Neurologic: Alert & oriented x 3, no focal neuro deficits Psychiatric: Speech and behavior appropriate   ED Course   Medications - No data to display  No orders of the defined types were placed in this encounter.   No results found for this or any previous visit (from the past 24 hour(s)). No results found.  ED Clinical Impression  Acute exacerbation of chronic low back pain   ED Assessment/Plan  Previous records reviewed.  As noted in HPI.  Patient overall is getting better, however he needs a work note so he can rest an extra few days.  Advised him to discontinue the ibuprofen 800 mg, will start on 600 mg of ibuprofen combined with 1 g of Tylenol, will try some Robaxin 1500 mg 3 times daily as he states the Flexeril is helping him slightly, and a Medrol Dosepak.  Will write a work note for today Monday.  He is not working over the weekend.  Will provide a primary care referral list for ongoing care.   No evidence of spinal cord involvement based on H&P. Pt describing typical back pain, has been < 6 week duration. No historical red flags as noted in HPI. No physical red flags such as fever, bony tenderness, lower extremity weakness, saddle anesthesia. Imaging not indicated at this time.    Discussed medical decision-making, and plan for follow-up with the patient.  Discussed signs and symptoms that should prompt return to the emergency department.  Patient agrees with plan.   Meds ordered this encounter  Medications  . ibuprofen (ADVIL,MOTRIN) 600 MG tablet    Sig: Take 1 tablet (600 mg total) by mouth every 6 (six) hours as needed.    Dispense:  30 tablet    Refill:  0  . methocarbamol (ROBAXIN) 750 MG tablet    Sig: Take 2 tablets (1,500 mg total) by mouth 3 (three) times daily for 5 days.    Dispense:  30 tablet    Refill:  0  .  methylPREDNISolone (MEDROL DOSEPAK) 4 MG TBPK tablet    Sig: Take by mouth daily. Follow package instructions    Dispense:  21 tablet    Refill:  0    *This clinic note was created using Dragon dictation software. Therefore, there may be occasional mistakes despite careful proofreading.  ?    Melynda Ripple, MD 01/21/18 2108

## 2020-11-11 ENCOUNTER — Ambulatory Visit (HOSPITAL_COMMUNITY)
Admission: EM | Admit: 2020-11-11 | Discharge: 2020-11-11 | Disposition: A | Discharge status: Home / Self Care | Payer: Self-pay | Attending: Family Medicine | Admitting: Family Medicine

## 2020-11-11 ENCOUNTER — Encounter (HOSPITAL_COMMUNITY): Payer: Self-pay

## 2020-11-11 ENCOUNTER — Other Ambulatory Visit: Payer: Self-pay

## 2020-11-11 DIAGNOSIS — J029 Acute pharyngitis, unspecified: Secondary | ICD-10-CM | POA: Insufficient documentation

## 2020-11-11 LAB — POCT RAPID STREP A, ED / UC: Streptococcus, Group A Screen (Direct): NEGATIVE

## 2020-11-11 NOTE — ED Triage Notes (Signed)
Pt in with c/o ST that started last Thursday. States his throat is very red and it hurts worse when he tries to eat  Denies any other sx

## 2020-11-11 NOTE — ED Provider Notes (Signed)
[facility]    CSN: [id number] Arrival date & time: 11/11/20  0831      History   Chief Complaint Chief Complaint  Patient presents with   Sore Throat    HPI Dean Palmer is a 45 y.o. male.   Patient presenting today with 5 to 6-day history of sore, swollen throat.  Denies fever, chills, cough, congestion, abdominal pain, nausea vomiting or diarrhea.  No known sick contacts recently.  Has not been trying anything over-the-counter for symptoms thus far.  No known history of seasonal allergies or other pertinent medical problems.   Past Medical History:  Diagnosis Date   Alcoholism ([place])    Anxiety    Arthritis    DDD Lumbar   Bipolar 1 disorder ([place])    Depression    followed by Candis Schatz; followed every six months to nine months.   GERD (gastroesophageal reflux disease)    HLD (hyperlipidemia)    now under control   Ulcer    associated with alcohol use    Patient Active Problem List   Diagnosis Date Noted   Oral infection 12/27/2013   Nausea with vomiting 12/01/2013   Abdominal pain 07/21/2013   Acute bronchitis 08/09/2012   Viral URI with cough 08/05/2012   Bipolar disorder ([place]) 04/12/2012   Flu 04/08/2011   UNSPECIFIED VITAMIN D DEFICIENCY 06/10/2009   BACK PAIN WITH RADICULOPATHY 05/31/2009   FATIGUE 05/31/2009   NUMBNESS, HAND 05/31/2009   SNORING 05/31/2009    Past Surgical History:  Procedure Laterality Date   WISDOM TOOTH EXTRACTION         Home Medications    Prior to Admission medications   Medication Sig Start Date End Date Taking? Authorizing Provider  ibuprofen (ADVIL,MOTRIN) 600 MG tablet Take 1 tablet (600 mg total) by mouth every 6 (six) hours as needed. 01/21/18   Melynda Ripple, MD  methylPREDNISolone (MEDROL DOSEPAK) 4 MG TBPK tablet Take by mouth daily. Follow package instructions 01/21/18   Melynda Ripple, MD    Family History Family History  Problem Relation Age of Onset   Arthritis Father    Diabetes  Father    Colon polyps Father    Prostate cancer Paternal Grandfather        or possibly colon cancer   Asthma Mother    COPD Mother    Lung cancer Other        MG aunt x 4    Social History Social History   Tobacco Use   Smoking status: Former    Packs/day: 0.25    Types: Cigarettes    Quit date: 11/28/2013    Years since quitting: 6.9   Smokeless tobacco: Never  Vaping Use   Vaping Use: Never used  Substance Use Topics   Alcohol use: Yes    Comment: occasional    Drug use: Yes    Types: Marijuana     Allergies   Tramadol   Review of Systems Review of Systems Per HPI  Physical Exam Triage Vital Signs ED Triage Vitals  Enc Vitals Group     BP 11/11/20 0914 (!) 161/93     Pulse Rate 11/11/20 0914 86     Resp 11/11/20 0914 18     Temp 11/11/20 0914 99.1 F (37.3 C)     Temp Source 11/11/20 0914 Oral     SpO2 11/11/20 0914 99 %     Weight --      Height --      Head  Circumference --      Peak Flow --      Pain Score 11/11/20 0912 5     Pain Loc --      Pain Edu? --      Excl. in [place]? --    No data found.  Updated Vital Signs BP (!) 161/93 (BP Location: Left Arm)   Pulse 86   Temp 99.1 F (37.3 C) (Oral)   Resp 18   SpO2 99%   Visual Acuity Right Eye Distance:   Left Eye Distance:   Bilateral Distance:    Right Eye Near:   Left Eye Near:    Bilateral Near:     Physical Exam Vitals and nursing note reviewed.  Constitutional:      Appearance: Normal appearance.  HENT:     Head: Atraumatic.     Right Ear: Tympanic membrane normal.     Left Ear: Tympanic membrane normal.     Nose: Nose normal.     Mouth/Throat:     Mouth: Mucous membranes are moist.     Pharynx: Posterior oropharyngeal erythema present. No oropharyngeal exudate.     Comments: No tonsillar edema bilaterally.  Uvula midline, oral airway patent Eyes:     Extraocular Movements: Extraocular movements intact.     Conjunctiva/sclera: Conjunctivae normal.  Cardiovascular:      Rate and Rhythm: Normal rate and regular rhythm.  Pulmonary:     Effort: Pulmonary effort is normal.     Breath sounds: Normal breath sounds. No wheezing or rales.  Abdominal:     General: Bowel sounds are normal. There is no distension.     Palpations: Abdomen is soft.     Tenderness: There is no abdominal tenderness. There is no guarding.  Musculoskeletal:        General: Normal range of motion.     Cervical back: Normal range of motion and neck supple.  Skin:    General: Skin is warm and dry.     Findings: No rash.  Neurological:     General: No focal deficit present.     Mental Status: He is oriented to person, place, and time.  Psychiatric:        Mood and Affect: Mood normal.        Thought Content: Thought content normal.        Judgment: Judgment normal.     UC Treatments / Results  Labs (all labs ordered are listed, but only abnormal results are displayed) Labs Reviewed  CULTURE, GROUP A STREP [facility])  POCT RAPID STREP A, ED / UC    EKG   Radiology No results found.  Procedures Procedures (including critical care time)  Medications Ordered in UC Medications - No data to display  Initial Impression / Assessment and Plan / UC Course  I have reviewed the triage vital signs and the nursing notes.  Pertinent labs & imaging results that were available during my care of the patient were reviewed by me and considered in my medical decision making (see chart for details).     Mildly hypertensive in triage otherwise vital signs benign and reassuring.  Exam overall very reassuring that significant tonsillar findings.  Rapid strep negative, throat culture pending.  Declines COVID testing as he does not have other symptoms.  Will await throat culture, adjust if needed.  He declines symptomatic management medications at this time and will try over-the-counter supportive medications and home care.  Return for acutely worsening symptoms.  Final Clinical  Impressions(s) /  UC Diagnoses   Final diagnoses:  Pharyngitis, unspecified etiology   Discharge Instructions   None    ED Prescriptions   None    PDMP not reviewed this encounter.   Merrie Roof [place], [place] 11/11/20 [id number]

## 2020-11-13 LAB — CULTURE, GROUP A STREP (THRC)

## 2023-12-16 ENCOUNTER — Emergency Department (HOSPITAL_COMMUNITY)

## 2023-12-16 ENCOUNTER — Observation Stay (HOSPITAL_COMMUNITY)
Admission: EM | Admit: 2023-12-16 | Discharge: 2023-12-17 | Disposition: A | Discharge status: Home / Self Care | Attending: Infectious Diseases | Admitting: Infectious Diseases

## 2023-12-16 ENCOUNTER — Other Ambulatory Visit: Payer: Self-pay

## 2023-12-16 ENCOUNTER — Encounter (HOSPITAL_COMMUNITY): Payer: Self-pay

## 2023-12-16 DIAGNOSIS — R7989 Other specified abnormal findings of blood chemistry: Secondary | ICD-10-CM | POA: Diagnosis not present

## 2023-12-16 DIAGNOSIS — T402X1A Poisoning by other opioids, accidental (unintentional), initial encounter: Secondary | ICD-10-CM | POA: Diagnosis present

## 2023-12-16 DIAGNOSIS — M545 Low back pain, unspecified: Secondary | ICD-10-CM | POA: Diagnosis not present

## 2023-12-16 DIAGNOSIS — D72829 Elevated white blood cell count, unspecified: Secondary | ICD-10-CM | POA: Diagnosis not present

## 2023-12-16 DIAGNOSIS — I2489 Other forms of acute ischemic heart disease: Secondary | ICD-10-CM | POA: Diagnosis not present

## 2023-12-16 DIAGNOSIS — F17210 Nicotine dependence, cigarettes, uncomplicated: Secondary | ICD-10-CM | POA: Diagnosis not present

## 2023-12-16 DIAGNOSIS — T50905A Adverse effect of unspecified drugs, medicaments and biological substances, initial encounter: Secondary | ICD-10-CM | POA: Diagnosis not present

## 2023-12-16 DIAGNOSIS — G8929 Other chronic pain: Secondary | ICD-10-CM | POA: Insufficient documentation

## 2023-12-16 DIAGNOSIS — F3162 Bipolar disorder, current episode mixed, moderate: Secondary | ICD-10-CM | POA: Diagnosis not present

## 2023-12-16 DIAGNOSIS — Z8674 Personal history of sudden cardiac arrest: Secondary | ICD-10-CM | POA: Diagnosis not present

## 2023-12-16 DIAGNOSIS — T50901A Poisoning by unspecified drugs, medicaments and biological substances, accidental (unintentional), initial encounter: Principal | ICD-10-CM

## 2023-12-16 LAB — HEPATITIS PANEL, ACUTE
HCV Ab: NONREACTIVE
Hep A IgM: NONREACTIVE
Hep B C IgM: NONREACTIVE
Hepatitis B Surface Ag: NONREACTIVE

## 2023-12-16 LAB — COMPREHENSIVE METABOLIC PANEL WITH GFR
ALT: 12 U/L (ref 0–44)
AST: 19 U/L (ref 15–41)
Albumin: 3.6 g/dL (ref 3.5–5.0)
Alkaline Phosphatase: 49 U/L (ref 38–126)
Anion gap: 9 (ref 5–15)
BUN: 10 mg/dL (ref 6–20)
CO2: 24 mmol/L (ref 22–32)
Calcium: 8.4 mg/dL — ABNORMAL LOW (ref 8.9–10.3)
Chloride: 107 mmol/L (ref 98–111)
Creatinine, Ser: 1.01 mg/dL (ref 0.61–1.24)
GFR, Estimated: >60 mL/min (ref >60)
Glucose, Bld: 102 mg/dL — ABNORMAL HIGH (ref 70–99)
Potassium: 4.4 mmol/L (ref 3.5–5.1)
Sodium: 140 mmol/L (ref 135–145)
Total Bilirubin: 0.8 mg/dL (ref 0.0–1.2)
Total Protein: 6.7 g/dL (ref 6.5–8.1)

## 2023-12-16 LAB — CBC WITH DIFFERENTIAL/PLATELET
Abs Immature Granulocytes: 0.2 K/uL — ABNORMAL HIGH (ref 0.00–0.07)
Basophils Absolute: 0.1 K/uL (ref 0.0–0.1)
Basophils Relative: 1 %
Eosinophils Absolute: 0.1 K/uL (ref 0.0–0.5)
Eosinophils Relative: 1 %
HCT: 40.7 % (ref 39.0–52.0)
Hemoglobin: 13.4 g/dL (ref 13.0–17.0)
Immature Granulocytes: 2 %
Lymphocytes Relative: 10 %
Lymphs Abs: 1.2 K/uL (ref 0.7–4.0)
MCH: 28.3 pg (ref 26.0–34.0)
MCHC: 32.9 g/dL (ref 30.0–36.0)
MCV: 86 fL (ref 80.0–100.0)
Monocytes Absolute: 0.7 K/uL (ref 0.1–1.0)
Monocytes Relative: 6 %
Neutro Abs: 9.8 K/uL — ABNORMAL HIGH (ref 1.7–7.7)
Neutrophils Relative %: 80 %
Platelets: 223 K/uL (ref 150–400)
RBC: 4.73 MIL/uL (ref 4.22–5.81)
RDW: 13.3 % (ref 11.5–15.5)
WBC: 12 K/uL — ABNORMAL HIGH (ref 4.0–10.5)
nRBC: 0 % (ref 0.0–0.2)

## 2023-12-16 LAB — TROPONIN I (HIGH SENSITIVITY)
Troponin I (High Sensitivity): 50 ng/L — ABNORMAL HIGH (ref <18)
Troponin I (High Sensitivity): 91 ng/L — ABNORMAL HIGH (ref <18)
Troponin I (High Sensitivity): 111 ng/L (ref <18)
Troponin I (High Sensitivity): 157 ng/L (ref <18)
Troponin I (High Sensitivity): 91 ng/L — ABNORMAL HIGH (ref <18)

## 2023-12-16 LAB — RAPID URINE DRUG SCREEN, HOSP PERFORMED
Amphetamines: NOT DETECTED
Barbiturates: NOT DETECTED
Benzodiazepines: NOT DETECTED
Cocaine: NOT DETECTED
Opiates: NOT DETECTED
Tetrahydrocannabinol: POSITIVE — AB

## 2023-12-16 LAB — HEPATITIS B SURFACE ANTIBODY,QUALITATIVE: Hep B S Ab: NONREACTIVE

## 2023-12-16 LAB — URINALYSIS, ROUTINE W REFLEX MICROSCOPIC
Bilirubin Urine: NEGATIVE
Glucose, UA: NEGATIVE mg/dL
Hgb urine dipstick: NEGATIVE
Ketones, ur: NEGATIVE mg/dL
Leukocytes,Ua: NEGATIVE
Nitrite: NEGATIVE
Protein, ur: NEGATIVE mg/dL
Specific Gravity, Urine: 1.018 (ref 1.005–1.030)
pH: 5 (ref 5.0–8.0)

## 2023-12-16 LAB — HIV ANTIBODY (ROUTINE TESTING W REFLEX): HIV Screen 4th Generation wRfx: NONREACTIVE

## 2023-12-16 MED ORDER — SODIUM CHLORIDE 0.9 % IV BOLUS
1000.0000 mL | Freq: Once | INTRAVENOUS | Status: AC
  Administered 2023-12-16: 1000 mL via INTRAVENOUS

## 2023-12-16 MED ORDER — ACETAMINOPHEN 325 MG PO TABS
650.0000 mg | ORAL_TABLET | Freq: Four times a day (QID) | ORAL | Status: DC | PRN
  Administered 2023-12-16 – 2023-12-17 (×2): 650 mg via ORAL
  Filled 2023-12-16 (×2): qty 2

## 2023-12-16 MED ORDER — KETOROLAC TROMETHAMINE 15 MG/ML IJ SOLN
15.0000 mg | Freq: Once | INTRAMUSCULAR | Status: AC
  Administered 2023-12-17: 15 mg via INTRAVENOUS
  Filled 2023-12-16: qty 1

## 2023-12-16 MED ORDER — RIVAROXABAN 10 MG PO TABS
10.0000 mg | ORAL_TABLET | Freq: Every day | ORAL | Status: DC
  Filled 2023-12-16: qty 1

## 2023-12-16 MED ORDER — LIDOCAINE 5 % EX PTCH
1.0000 | MEDICATED_PATCH | CUTANEOUS | Status: DC
Start: 2023-12-16 — End: 2023-12-17
  Administered 2023-12-16: 1 via TRANSDERMAL
  Filled 2023-12-16: qty 1

## 2023-12-16 MED ORDER — ACETAMINOPHEN 650 MG RE SUPP: 650.0000 mg | Freq: Four times a day (QID) | RECTAL | Status: DC | PRN

## 2023-12-16 MED ORDER — SENNOSIDES-DOCUSATE SODIUM 8.6-50 MG PO TABS: 1.0000 | ORAL_TABLET | Freq: Every evening | ORAL | Status: DC | PRN

## 2023-12-16 NOTE — ED Notes (Signed)
 Pt reported leaving AMA, IV removed at pt request. Cardiology arrived at bedside at this time.

## 2023-12-16 NOTE — ED Notes (Signed)
 Pt asked for urine sample but unable to urinate at this time. Urinal provided to bedside. Will continue to monitor

## 2023-12-16 NOTE — Discharge Instructions (Addendum)
 You were seen in the emergency department today for concerns of a suspected drug overdose.  Your labs were concerning for an elevated troponin level which could be due to muscle injury to your heart from a suspected overdose or even from CPR that was performed on you.  You are choosing to leave AGAINST MEDICAL ADVICE and I would strongly caution you to return to the emergency department if you have any new or changing symptoms.  Avoid any substance such as cocaine, meth, fentanyl, marijuana, alcohol as these can worsen your condition.

## 2023-12-16 NOTE — Consult Note (Signed)
 Cardiology Consultation  Patient ID: Dean Palmer MRN: [id number]; DOB: 1975-12-26  Admit date: 12/16/2023 Date of Consult: 12/16/2023  PCP:  Freddrick, No   [facility] Providers Cardiologist:  New  Patient Profile: Dean Palmer is a 48 y.o. male with a hx of GERD, bipolar 1, anxiety, depression, history of alcoholism (quit 2 years ago), prior tobacco use recently quit, DDD,  who is being seen 12/16/2023 for the evaluation of elevated troponin at the request of Dr. Freddi.  History of Present Illness: Dean Palmer has past medical history listed above.  He presented to the [facility] emergency department on 12/16/2023 via EMS after being found down and unresponsive at work.  EMT reported that upon arrival to the scene he was pulseless, apneic, CPR was started 3 total rounds were completed.  EMS then arrived and noted patient to have good pulses, agonal breaths.  2.5 mg of Narcan was administered.  Patient reports that he had taken a little blue pill from someone at work, unaware what drug it is. He has been alert and oriented since arrival to the ED. he was consulted in the setting of elevated troponins and chest pain.  Relevant workup in the ED includes: CBC shows mildly elevated white count of 12K, CMP unremarkable, troponin 50 ? 91 ? 111, UA normal. UDS positive for THC. EKG showed sinus rhythm HR 92, no acute ischemic changes.   Since being in the ED he has been given a 1L bolus of IV fluids. Vitals have been fairly stable HR 70-90s with BP 120-130s/70-80s, temp 98.1, SpO2 100% on room air.  After speaking with the patient, agrees with the history as stated above.  He tells me that he was given a medication by a coworker that to his knowledge was for pain.  He tells me that he believes now that it likely had some component of fentanyl.  He denies any active chest pain, shortness of breath.  He does note that the chest pain that he was experiencing was very likely related to his CPR.   He is still tender to palpation of his chest wall.  He denies any recent exertional symptoms.  He tells me that he has a history of alcoholism but has quit drinking any alcohol as of 2 years ago.  He used to smoke a half a pack of cigarettes a day and recently quit.  He tells me that he recently got health insurance, after being without it for many years.  Due to this, he has not been seen for proper annual physical or seen a PCP in upwards of 10 years.  He does plan on establishing somewhere now that he has insurance.  Past Medical History:  Diagnosis Date   Alcoholism [facility])    Reports sober since 2023   Anxiety    Arthritis    DDD Lumbar   Bipolar 1 disorder ([facility])    Depression    followed by [facility]; followed every six months to nine months.   GERD (gastroesophageal reflux disease)    Ulcer    associated with alcohol use   Past Surgical History:  Procedure Laterality Date   WISDOM TOOTH EXTRACTION      Home Medications:  Prior to Admission medications   Medication Sig Start Date End Date Taking? Authorizing Provider  acetaminophen  (TYLENOL ) 500 MG tablet Take 500-1,000 mg by mouth every 6 (six) hours as needed for moderate pain (pain score 4-6).   Yes [provider]  ibuprofen  (  ADVIL ,MOTRIN ) 600 MG tablet Take 1 tablet (600 mg total) by mouth every 6 (six) hours as needed. 01/21/18  Yes Van Knee, MD   Scheduled Meds:  Continuous Infusions:  PRN Meds:  Allergies:    Allergies  Allergen Reactions   Tramadol  Nausea Only    Pt states it was 1 time intolerance. He just felt nauseous.   Social History:   Social History   Socioeconomic History   Marital status: Married    Spouse name: Not on file   Number of children: 1   Years of education: Not on file   Highest education level: Not on file  Occupational History   Occupation: apprentice//electrician    Employer: [employer]    Employer: [employer]  Tobacco Use    Smoking status: Former    Types: Cigarettes   Smokeless tobacco: Never  Vaping Use   Vaping status: Never Used  Substance and Sexual Activity   Alcohol use: Not Currently    Comment: Quit 2023   Drug use: Yes    Types: Marijuana    Comment: denies any current drug use, UDS positive for THC   Sexual activity: Yes  Other Topics Concern   Not on file  Social History Narrative   Marital status: married x 6 years; happily married.   Children: 1 son (37 yo)   Lives: with wife, son, mother-in-law.   Employment:  Personnel officer.   Tobacco: reports quit recently, used to smoke 0.5 ppd.    Alcohol: previous heavier use, quit 2023.   Drugs: none; previous marijuana years ago.    Exercise:  None   Seatbelt: 100%   Sunscreen: SPF 15   Guns: none   Social Drivers of Corporate investment banker Strain: Not on file  Food Insecurity: Not on file  Transportation Needs: Not on file  Physical Activity: Not on file  Stress: Not on file  Social Connections: Not on file  Intimate Partner Violence: Not on file    Family History:   Family History  Problem Relation Age of Onset   Arthritis Father    Diabetes Father    Colon polyps Father    Prostate cancer Paternal Grandfather        or possibly colon cancer   Asthma Mother    COPD Mother    Lung cancer Other        MG aunt x 4    ROS:  Please see the history of present illness.  All other ROS reviewed and negative.     Physical Exam/Data: Vitals:   12/16/23 1315 12/16/23 1327 12/16/23 1400 12/16/23 1430  BP: (!) 130/91  125/79 139/80  Pulse: 82  73 79  Resp: 14  10 18   Temp:  98.1 F (36.7 C)    TempSrc:  Oral    SpO2: 100%  100% 100%  Weight:      Height:       No intake or output data in the 24 hours ending 12/16/23 1447    12/16/2023    9:36 AM 12/01/2017    6:02 PM 12/27/2013    1:44 PM  Last 3 Weights  Weight (lbs) 200 lb 176 lb 169 lb 3.2 oz  Weight (kg) 90.719 kg 79.833 kg 76.749 kg     Body mass index is 26.39 kg/m.    General:  in no acute distress, on room air HEENT: normal Neck: no JVD Vascular: Distal pulses 2+ bilaterally Cardiac:  normal S1, S2;  RRR, tender to palpation of the chest wall Lungs:  clear to auscultation bilaterally, no wheezing, rhonchi or rales  Abd: soft, non-tender Ext: no edema Musculoskeletal:  No deformities Skin: warm and dry  Neuro:  no focal abnormalities noted Psych:  Normal affect   EKG:  The EKG was personally reviewed and demonstrates:  sinus rhythm, no acute ischemic changes   Telemetry:  Telemetry was personally reviewed and demonstrates: Sinus rhythm, HR 70s  Relevant CV Studies:  Echocardiogram, 12/16/2023 Ordered, pending results  Laboratory Data: High Sensitivity Troponin:   Recent Labs  Lab 12/16/23 1002 12/16/23 1107 12/16/23 1248  TROPONINIHS 50* 91* 111*     Chemistry Recent Labs  Lab 12/16/23 1002  NA 140  K 4.4  CL 107  CO2 24  GLUCOSE 102*  BUN 10  CREATININE 1.01  CALCIUM 8.4*  GFRNONAA >60  ANIONGAP 9    Recent Labs  Lab 12/16/23 1002  PROT 6.7  ALBUMIN 3.6  AST 19  ALT 12  ALKPHOS 49  BILITOT 0.8   Lipids No results for input(s): CHOL, TRIG, HDL, LABVLDL, LDLCALC, CHOLHDL in the last 168 hours.   Hematology Recent Labs  Lab 12/16/23 1002  WBC 12.0*  RBC 4.73  HGB 13.4  HCT 40.7  MCV 86.0  MCH 28.3  MCHC 32.9  RDW 13.3  PLT 223   Thyroid No results for input(s): TSH, FREET4 in the last 168 hours.  BNPNo results for input(s): BNP, PROBNP in the last 168 hours.  DDimer No results for input(s): DDIMER in the last 168 hours.  Radiology/Studies:  DG Chest 2 View Result Date: 12/16/2023 EXAM: 2 VIEW(S) XRAY OF THE CHEST 12/16/2023 10:12:00 AM COMPARISON: 04/14/2011 CLINICAL HISTORY: Chest pain, post-CPR. Post CPR, Overdose. FINDINGS: LINES, TUBES AND DEVICES: Telemetry leads noted. LUNGS AND PLEURA: No focal pulmonary opacity. No pulmonary edema. No pleural effusion. No pneumothorax.  HEART AND MEDIASTINUM: No acute abnormality of the cardiac and mediastinal silhouettes. BONES AND SOFT TISSUES: No acute osseous abnormality. IMPRESSION: 1. Normal chest radiograph without acute cardiopulmonary process. Electronically signed by: Waddell Calk MD 12/16/2023 11:04 AM EDT RP Workstation: [id number]   Assessment and Plan:  Chest wall tenderness Elevated troponin  Presented to ED via EMS after being found down at work Noted to have taken a little blue pill from someone  Reported to have received 3 rounds of CPR  EMS administered 2.5 mg Narcan in field with good response  EKG shows NSR with no acute ischemic changes Troponin 50 ? 91 ? 111 Patient is tender to palpation of chest wall on physical exam Denies any recent exertional anginal symptoms Not on any medications for hypertension, diabetes, hyperlipidemia Reports quitting alcohol around 2 years ago, recently stopping tobacco use Low suspicion for ACS, suspect chest wall tenderness and elevated troponin are secondary to arrest/CPR Order echocardiogram, will follow results Check A1C, lipid panel in AM  Per primary Accidental overdose Substance use, THC Mood disorders GERD DDD  For questions or updates, please contact [facility] Please consult [email or web address] for contact info under     Signed, Waddell DELENA Donath, PA-C  12/16/2023 2:47 PM

## 2023-12-16 NOTE — H&P (Signed)
 Date: 12/16/2023               Patient Name:  Dean Palmer MRN: [id number]  DOB: 1976/02/20 Age / Sex: 48 y.o., male   PCP: Pcp, No         Medical Service: Internal Medicine Teaching Service         Attending Physician: Dr. Eben Reyes BROCKS, MD      First Contact: Melvenia Morrison, MD  Pager:  [phone]  Second Contact: Dr. Norman Lobstein, DO  Pager:  [phone]       After Hours  (After 5pm / First Contact Pager: [phone]  weekends / holidays): Second Contact Pager: [phone]   SUBJECTIVE   Chief Complaint: Cardiac arrest  History of Present Illness: Dean Palmer is a 48 y.o. male with PMH of chronic lower back pain who was brought to the ED by EMS after being found down at work. Per his report, he took a pill that he thought to be Roxicodone around 9:30 that was given to him by a friend to try and manage his lower back pain. He notes that it was a small blue pill. His friend thought that it was Roxicodone but it was not a medication that his friend was prescribed. He was then found down by his coworkers without a pulse and bystander CPR was initiated. By the time EMS arrived, his pulse had returned and he was breathing agonally. 2.5 mg of Narcan was administered and the patient responded well to the medication.   Now on interview, he does have persistent chest pain that he thinks is a soreness over his ribs where he had CPR. He has no other symptoms and feels he is at his baseline. He denies shortness of breath, headaches, vision loss, nausea and vomiting, and leg swelling. He has not history of exertional symptoms.   ED Course: Vitals were 142/88 originally with respirations of 8 initially. He was saturating at 99% on room air Labs significant for troponin trending up from 50-91-111-157. Mild leukocytosis to 12.0. UA positive for THC but nothing else. Imaging showed no acute cardiopulmonary process Received 1L NS in the ER Consulted cardiology, who recommend Echocardiogram and  observation due to increasing troponins and concern for cardiorespiratory arrest.  Meds:   Current Meds  Medication Sig   acetaminophen  (TYLENOL ) 500 MG tablet Take 500-1,000 mg by mouth every 6 (six) hours as needed for moderate pain (pain score 4-6).   ibuprofen  (ADVIL ,MOTRIN ) 600 MG tablet Take 1 tablet (600 mg total) by mouth every 6 (six) hours as needed.    Past Medical History Past Medical History:  Diagnosis Date   Alcoholism (HCC)    Reports sober since 2023   Anxiety    Arthritis    DDD Lumbar   Bipolar 1 disorder (HCC)    Depression    followed by [facility]; followed every six months to nine months.   GERD (gastroesophageal reflux disease)    Ulcer    associated with alcohol use    Past Surgical History Past Surgical History:  Procedure Laterality Date   WISDOM TOOTH EXTRACTION      Social:  Lives With: wife with epilepsy, he is her caretaker Occupation: Holiday representative Support: Family Level of Function: Independent PCP: None but would like to establish care with Family Medicine practice, as that is where his son goes. Substances: -Tobacco: Recently quit smoking tobacco -Alcohol: Has been sober for 2 years -Recreational Drug: No history of cocaine, heroin, or prescription misuse.  Family History:  Family History  Problem Relation Age of Onset   Arthritis Father    Diabetes Father    Colon polyps Father    Prostate cancer Paternal Grandfather        or possibly colon cancer   Asthma Mother    COPD Mother    Lung cancer Other        MG aunt x 4    Allergies: Allergies as of 12/16/2023 - Review Complete 12/16/2023  Allergen Reaction Noted   Tramadol  Nausea Only 04/14/2011    Review of Systems: A complete ROS was negative except as per HPI.   OBJECTIVE:   Physical Exam: Blood pressure (!) 130/91, pulse 82, temperature 98.1 F (36.7 C), temperature source Oral, resp. rate 14, height 6' 1 (1.854 m), weight 90.7 kg, SpO2 100%.   Constitutional: alert , well-appearing  sitting in bed, in no acute distress HENT: normocephalic atraumatic, mucous membranes moist Eyes: conjunctiva non-erythematous Neck: supple Cardiovascular: regular rate and occasional irregular rhythm, no m/r/g, Tender over the anterior chest. Pulmonary/Chest: normal work of breathing on room air. Initial with crackles at the bases of his lungs that cleared when he was coughing Abdominal: bowel sounds present, soft, non-tender, non-distended MSK: normal bulk and tone Neurological: alert & oriented x 3, 5/5 strength in bilateral upper and lower extremities, normal gait Skin: warm and dry  Labs: CBC    Component Value Date/Time   WBC 12.0 (H) 12/16/2023 1002   RBC 4.73 12/16/2023 1002   HGB 13.4 12/16/2023 1002   HCT 40.7 12/16/2023 1002   PLT 223 12/16/2023 1002   MCV 86.0 12/16/2023 1002   MCH 28.3 12/16/2023 1002   MCHC 32.9 12/16/2023 1002   RDW 13.3 12/16/2023 1002   LYMPHSABS 1.2 12/16/2023 1002   MONOABS 0.7 12/16/2023 1002   EOSABS 0.1 12/16/2023 1002   BASOSABS 0.1 12/16/2023 1002     CMP     Component Value Date/Time   NA 140 12/16/2023 1002   K 4.4 12/16/2023 1002   CL 107 12/16/2023 1002   CO2 24 12/16/2023 1002   GLUCOSE 102 (H) 12/16/2023 1002   BUN 10 12/16/2023 1002   CREATININE 1.01 12/16/2023 1002   CALCIUM 8.4 (L) 12/16/2023 1002   PROT 6.7 12/16/2023 1002   ALBUMIN 3.6 12/16/2023 1002   AST 19 12/16/2023 1002   ALT 12 12/16/2023 1002   ALKPHOS 49 12/16/2023 1002   BILITOT 0.8 12/16/2023 1002   GFRNONAA >60 12/16/2023 1002    Latest Reference Range & Units 12/16/23 14:04  Urinalysis, Routine w reflex microscopic  Rpt  Appearance CLEAR  CLEAR  Bilirubin Urine NEGATIVE  NEGATIVE  Color, Urine YELLOW  YELLOW  Glucose, UA NEGATIVE mg/dL NEGATIVE  Hgb urine dipstick NEGATIVE  NEGATIVE  Ketones, ur NEGATIVE mg/dL NEGATIVE  Leukocytes,Ua NEGATIVE  NEGATIVE  Nitrite NEGATIVE  NEGATIVE  pH 5.0 - 8.0  5.0   Protein NEGATIVE mg/dL NEGATIVE  Specific Gravity, Urine 1.005 - 1.030  1.018  Amphetamines NONE DETECTED  NONE DETECTED  Barbiturates NONE DETECTED  NONE DETECTED  Benzodiazepines NONE DETECTED  NONE DETECTED  Opiates NONE DETECTED  NONE DETECTED  COCAINE NONE DETECTED  NONE DETECTED  Tetrahydrocannabinol NONE DETECTED  POSITIVE !  !: Data is abnormal Rpt: View report in Results Review for more information   Latest Reference Range & Units 12/16/23 10:02 12/16/23 11:07 12/16/23 12:48 12/16/23 15:30  Troponin I (High Sensitivity) <18 ng/L 50 (H) 91 (H) 111 (HH) 157 (HH)  (  HH): Data is critically high (H): Data is abnormally high  Imaging:  DG Chest 2 View EXAM: 2 VIEW(S) XRAY OF THE CHEST 12/16/2023 10:12:00 AM  COMPARISON: 04/14/2011  CLINICAL HISTORY: Chest pain, post-CPR. Post CPR, Overdose.  FINDINGS:  LINES, TUBES AND DEVICES: Telemetry leads noted.  LUNGS AND PLEURA: No focal pulmonary opacity. No pulmonary edema. No pleural effusion. No pneumothorax.  HEART AND MEDIASTINUM: No acute abnormality of the cardiac and mediastinal silhouettes.  BONES AND SOFT TISSUES: No acute osseous abnormality.  IMPRESSION: 1. Normal chest radiograph without acute cardiopulmonary process.  Electronically signed by: Waddell Calk MD 12/16/2023 11:04 AM EDT RP Workstation: [id number]   EKG: personally reviewed my interpretation is NSR with no ischemic changes. Prior EKG not present.  ASSESSMENT & PLAN:   Assessment & Plan by Problem: Principal Problem:   Opioid overdose, accidental or unintentional, initial encounter (HCC) Active Problems:   Elevated troponin   Drug reaction   Dean Palmer is a 48 y.o. person living with a history of chronic lower back pain who presented after suspected cardiorespiratory arrest and CPR after suspected opioid overdose and admitted for observation due to rising troponins.  #Suspected Cardiac arrest #Elevated  troponin #Leukocytosis Patient was found down at work after taking a pill that he received from a friend.  He suspects that this pill was an opioid and he was told that it was Roxicodone.  He underwent bystander CPR in the field.  He received Narcan from EMS with good response.  His troponin has been uptrending from 50-157.  Suspect that his leukocytosis is reactive to either his cardiopulmonary arrest or chest compressions.  His EKG shows no ischemic changes.  Chest x-ray showed no acute process  He does have some tenderness over his anterior chest wall from where they did the CPR but has no shortness of breath or exertional symptoms.  He does not know if he has any risk factors for cardiac disease such as high blood pressure or diabetes says he has not seen a primary care doctor in 12 years.  Suspect that his increasing troponins and chest wall tenderness are likely due to to his possible cardiac arrest or S/P CPR.  Seen by cardiology in the ED who have ordered echocardiogram.  -Trend troponin q2 until peak -Lipid panel -A1C -Cardiology following, appreciate recommendations. -Echo ordered and pending - Will need to follow-up with a PCP, would like to establish with the family medicine residency as this is where he takes his son for medical care  #Suspected opiate overdose #Chronic lower back pain Suspect that his cardiorespiratory arrest was due to accidental opioid overdose.  The patient suspected that this pill was Roxicodone as he was told.  His UDS shows that he did not have any opioids however the standard UDS that we use here in the hospital does not contain fentanyl or other synthetic opioids.  I suspect that a synthetic opioid was the cause of his arrest given his acute improvement with naloxone. - Continue to monitor for signs of respiratory distress - Will manage his symptoms with PRN Tylenol  while he is here. Would avoid opioids. - Would potentially need a referral to a pain specialist  or spine specialist on an outpatient basis to address lower back pain.  #History of alcohol abuse #History of Bipolar 1 disorder Patient has had been sober from alcohol for 2 years and recently quit smoking. Due to his drug use and history of alcoholism, will screen for HIV, G/C, and hepatitis.  Also has not been to a primary care doctor in 12 years.  He has not been taking anything for his bipolar and it is possible that his impulsive behavior could be due to his underlying mental illness.  -HIV pending -G/C pending -Hepatitis panel pending  Diet: Normal VTE: DOAC IVF: None Code: Full Surrogate Decision Maker: Spouse Tamra  Prior to Admission Living Arrangement: Home, living with wife Anticipated Discharge Location: Home Barriers to Discharge: Elevated troponins  Dispo: Admit patient to Observation with expected length of stay less than 2 midnights.  Signed: Napoleon Limes, MD Internal Medicine Resident PGY-1 12/16/2023, 4:32 PM   Please contact IM Residency On-Call Pager at: [phone] or [phone].

## 2023-12-16 NOTE — ED Notes (Signed)
 Pt asked again to give urine sample, unable to at this time

## 2023-12-16 NOTE — ED Provider Notes (Signed)
 [place] EMERGENCY DEPARTMENT AT [facility] Provider Note   CSN: [id number] Arrival date & time: 12/16/23  9072     Patient presents with: Drug Overdose and Post CPR   Dean Palmer is a 48 y.o. male.  Patient to the emergency department with concerns of possible drug overdose and post CPR.  Reportedly was found down and unresponsive while at work by fellow coworkers.  Reportedly had 2-3 rounds of CPR performed by response team.  Fire department and EMS arrived and found patient to have pulses. Reportedly administered 2.5 mg of Narcan IM which helped patient to become responsive again. He reports that he took a pill from someone and this was not a prescription medication. Denies any substance use such as alcohol, IVDU, or any other substance.   Drug Overdose       Prior to Admission medications   Medication Sig Start Date End Date Taking? Authorizing Provider  acetaminophen  (TYLENOL ) 500 MG tablet Take 500-1,000 mg by mouth every 6 (six) hours as needed for moderate pain (pain score 4-6).   Yes [provider]  ibuprofen  (ADVIL ,MOTRIN ) 600 MG tablet Take 1 tablet (600 mg total) by mouth every 6 (six) hours as needed. 01/21/18  Yes Van Knee, MD    Allergies: Tramadol     Review of Systems  Constitutional:  Positive for activity change.  Psychiatric/Behavioral:  The patient is nervous/anxious.   All other systems reviewed and are negative.   Updated Vital Signs BP 139/80   Pulse 79   Temp 98.1 F (36.7 C) (Oral)   Resp 18   Ht 6' 1 (1.854 m)   Wt 90.7 kg   SpO2 100%   BMI 26.39 kg/m   Physical Exam Vitals and nursing note reviewed.  Constitutional:      General: He is not in acute distress.    Appearance: Normal appearance. He is well-developed. He is not ill-appearing.  HENT:     Head: Normocephalic and atraumatic.  Eyes:     Conjunctiva/sclera: Conjunctivae normal.  Cardiovascular:     Rate and Rhythm: Normal rate and regular  rhythm.     Heart sounds: No murmur heard. Pulmonary:     Effort: Pulmonary effort is normal. No respiratory distress.     Breath sounds: Normal breath sounds. No decreased breath sounds, wheezing, rhonchi or rales.  Abdominal:     Palpations: Abdomen is soft.     Tenderness: There is no abdominal tenderness.  Musculoskeletal:        General: Tenderness present. No swelling.     Cervical back: Neck supple.     Comments: TTP along the sternum and anterior chest wall.  Skin:    General: Skin is warm and dry.     Capillary Refill: Capillary refill takes less than 2 seconds.  Neurological:     Mental Status: He is alert.  Psychiatric:        Mood and Affect: Mood normal.     (all labs ordered are listed, but only abnormal results are displayed) Labs Reviewed  CBC WITH DIFFERENTIAL/PLATELET - Abnormal; Notable for the following components:      Result Value   WBC 12.0 (*)    Neutro Abs 9.8 (*)    Abs Immature Granulocytes 0.20 (*)    All other components within normal limits  COMPREHENSIVE METABOLIC PANEL WITH GFR - Abnormal; Notable for the following components:   Glucose, Bld 102 (*)    Calcium 8.4 (*)    All  other components within normal limits  RAPID URINE DRUG SCREEN, HOSP PERFORMED - Abnormal; Notable for the following components:   Tetrahydrocannabinol POSITIVE (*)    All other components within normal limits  TROPONIN I (HIGH SENSITIVITY) - Abnormal; Notable for the following components:   Troponin I (High Sensitivity) 50 (*)    All other components within normal limits  TROPONIN I (HIGH SENSITIVITY) - Abnormal; Notable for the following components:   Troponin I (High Sensitivity) 91 (*)    All other components within normal limits  TROPONIN I (HIGH SENSITIVITY) - Abnormal; Notable for the following components:   Troponin I (High Sensitivity) 111 (*)    All other components within normal limits  URINALYSIS, ROUTINE W REFLEX MICROSCOPIC  ETHANOL    EKG: EKG  Interpretation Date/Time:  Thursday December 16 2023 09:31:45 EDT Ventricular Rate:  92 PR Interval:  167 QRS Duration:  80 QT Interval:  349 QTC Calculation: 432 R Axis:   48  Text Interpretation: Sinus rhythm no acute ST/T changes No old tracing to compare Confirmed by Freddi Hamilton [id number]) on 12/16/2023 9:47:26 AM  Radiology: DG Chest 2 View Result Date: 12/16/2023 EXAM: 2 VIEW(S) XRAY OF THE CHEST 12/16/2023 10:12:00 AM COMPARISON: 04/14/2011 CLINICAL HISTORY: Chest pain, post-CPR. Post CPR, Overdose. FINDINGS: LINES, TUBES AND DEVICES: Telemetry leads noted. LUNGS AND PLEURA: No focal pulmonary opacity. No pulmonary edema. No pleural effusion. No pneumothorax. HEART AND MEDIASTINUM: No acute abnormality of the cardiac and mediastinal silhouettes. BONES AND SOFT TISSUES: No acute osseous abnormality. IMPRESSION: 1. Normal chest radiograph without acute cardiopulmonary process. Electronically signed by: Waddell Calk MD 12/16/2023 11:04 AM EDT RP Workstation: [id number]     Procedures   Medications Ordered in the ED  sodium chloride  0.9 % bolus 1,000 mL (0 mLs Intravenous Stopped 12/16/23 1450)    Clinical Course as of 12/16/23 1513  Thu Dec 16, 2023  1353 Spoke with Dr. Marylu, IM resident, who will consult on patient. [OZ]    Clinical Course User Index [OZ] Cecily Legrand LABOR, PA-C                                 Medical Decision Making Amount and/or Complexity of Data Reviewed Labs: ordered. Radiology: ordered.   This patient presents to the ED for concern of drug overdose, post CPR, this involves an extensive number of treatment options, and is a complaint that carries with it a high risk of complications and morbidity.  The differential diagnosis includes ACS, sepsis, cardiomyopathy, IV drug use, drug overdose   Co morbidities that complicate the patient evaluation  Bipolar disorder   Lab Tests:  I Ordered, and personally interpreted labs.  The pertinent results  include: CBC with leukocytosis of 12.0, CMP unremarkable, troponin initially elevated at 50 with repeat elevating to 91, ethanol pending, UA negative for infection, UDS positive for cannabis   Imaging Studies ordered:  I ordered imaging studies including chest x-ray I independently visualized and interpreted imaging which showed no acute findings I agree with the radiologist interpretation   Cardiac Monitoring: / EKG:  The patient was maintained on a cardiac monitor.  I personally viewed and interpreted the cardiac monitored which showed an underlying rhythm of: Sinus rhythm   Consultations Obtained:  I requested consultation with the cardiology, hospitalist,  and discussed lab and imaging findings as well as pertinent plan - they recommend: Spoke with Rollo Louder, cardiology PA, who advised they  will consult on patient.   Problem List / ED Course / Critical interventions / Medication management  Patient presents to the emergency department with concerns of suspected drug overdose and CPR.  Patient reportedly was found down while at work and had bystander CPR.  Reportedly was pulseless by bystander.  Report but when EMS and fire arrived on scene, patient was found to have a faint pulse.  Patient was somewhat somnolent and became easily roused after IM Narcan was administered.  He was able to state that he did take a pill from someone that was not his home medication.  He is unsure what was in this medication.  No history of similar overdoses and states that this was not an attempt to harm himself.  He endorses pain in his central chest likely at the site of CPR. On exam, patient is alert oriented at baseline with normal mentation and is easily arousable.  Vitals unremarkable.  No abnormal heart or lung sounds.  Some tenderness towards the anterior chest wall primarily towards the sternum. Workup shows troponin elevated at 50 with repeat elevated to 91.  CBC with mild leukocytosis at 12.0  and CMP unremarkable.  EKG largely unremarkable and chest x-ray shows no acute findings.  Consulted cardiology placed given elevated troponin which is likely secondary to patient's suspected arrest versus post CPR. Spoke with Rollo Louder, PA-C cardiology, who advised that they will be available to consult for patient.  Admit to medicine.  Medical admission placed. Spoke with Dr. Marylu, internal medicine resident, who will be evaluating patient for admission.  I did advise him that patient is apprehensive for admission and is requesting to leave due to concerns of no caretaker at home for his spouse. Patient's third troponin continuing to elevate but starting to flatten now at 111. Informed patient of findings and concerns. Wanting to leave AMA. After cardiology saw patient, he has reportedly changed his mind and would like further workup to ensure his heart is okay from this episode today. Informed IM resident, Dr. Marylu, that patient will be staying. Admission orders placed. I ordered medication including fluids for dehydration  Reevaluation of the patient after these medicines showed that the patient improved I have reviewed the patients home medicines and have made adjustments as needed   Social Determinants of Health:  History of substance use   Test / Admission - Considered:  Requiring admission  Final diagnoses:  Accidental overdose, initial encounter  Elevated troponin    ED Discharge Orders     None          Cecily Legrand LABOR, PA-C 12/16/23 1513    Freddi Hamilton, MD 12/16/23 1529

## 2023-12-16 NOTE — ED Notes (Signed)
 Patient transported to X-ray

## 2023-12-16 NOTE — ED Triage Notes (Signed)
 Pt BIB [facility] from work where he was found by employees laying down supine & unresponsive. EMT first on scene reports he was pulseless/apneic & started CPR, 2 round performed before FD arrived & they did one round. EMS reports upon their arrival he had a good carotid & radial pulse with agonal breathing, was given a total of 2.5 Narcan IM before arrival to ED & is now A/Ox4. Now c/o CP, was also given 4mg  zofran  d/t n/v, & 500cc NS via 20g Lt hand PIV. 12L unremarkable, pt reports he took a little blue pill unaware what drug was in it, EMS reports it could possibly be Fentanyl (per EMS).

## 2023-12-17 ENCOUNTER — Observation Stay (HOSPITAL_COMMUNITY)

## 2023-12-17 ENCOUNTER — Other Ambulatory Visit (HOSPITAL_COMMUNITY): Payer: Self-pay

## 2023-12-17 DIAGNOSIS — I469 Cardiac arrest, cause unspecified: Secondary | ICD-10-CM

## 2023-12-17 DIAGNOSIS — G8929 Other chronic pain: Secondary | ICD-10-CM

## 2023-12-17 DIAGNOSIS — Z8674 Personal history of sudden cardiac arrest: Secondary | ICD-10-CM | POA: Diagnosis not present

## 2023-12-17 DIAGNOSIS — T402X1A Poisoning by other opioids, accidental (unintentional), initial encounter: Secondary | ICD-10-CM | POA: Diagnosis not present

## 2023-12-17 DIAGNOSIS — I2489 Other forms of acute ischemic heart disease: Secondary | ICD-10-CM

## 2023-12-17 DIAGNOSIS — R7989 Other specified abnormal findings of blood chemistry: Secondary | ICD-10-CM

## 2023-12-17 DIAGNOSIS — M545 Low back pain, unspecified: Secondary | ICD-10-CM

## 2023-12-17 DIAGNOSIS — F3162 Bipolar disorder, current episode mixed, moderate: Secondary | ICD-10-CM | POA: Diagnosis not present

## 2023-12-17 LAB — HEMOGLOBIN A1C
Hgb A1c MFr Bld: 5 % (ref 4.8–5.6)
Mean Plasma Glucose: 96.8 mg/dL

## 2023-12-17 LAB — ECHOCARDIOGRAM COMPLETE
Area-P 1/2: 2.76 cm2
Calc EF: 68.8 %
Height: 73.031 in
S' Lateral: 3.3 cm
Single Plane A2C EF: 66.1 %
Single Plane A4C EF: 68.3 %
Weight: 3134.4 [oz_av]

## 2023-12-17 LAB — LIPID PANEL
Cholesterol: 133 mg/dL (ref 0–200)
HDL: 39 mg/dL — ABNORMAL LOW (ref >40)
LDL Cholesterol: 86 mg/dL (ref 0–99)
Total CHOL/HDL Ratio: 3.4 ratio
Triglycerides: 42 mg/dL (ref <150)
VLDL: 8 mg/dL (ref 0–40)

## 2023-12-17 MED ORDER — LIDOCAINE 5 % EX PTCH
1.0000 | MEDICATED_PATCH | CUTANEOUS | 0 refills | Status: DC
  Filled 2023-12-17: qty 10, 10d supply, fill #0

## 2023-12-17 MED ORDER — KETOROLAC TROMETHAMINE 15 MG/ML IJ SOLN
15.0000 mg | Freq: Once | INTRAMUSCULAR | Status: AC
Start: 2023-12-17 — End: 2023-12-17
  Administered 2023-12-17: 15 mg via INTRAVENOUS
  Filled 2023-12-17: qty 1

## 2023-12-17 NOTE — Progress Notes (Signed)
 RN received phone call from a case manager from [employer]. RN conducted phone call conversation at bedside with patient present. Although the patient was agreeable to allow RN to share patient information, via this tele-phone call, no patient information was provided. Concluding plan, is for indicated case manager to call patients personal phone number to receive patient related care updates. Patient is agreeable to this plan.

## 2023-12-17 NOTE — Discharge Summary (Signed)
 Name: Dean Palmer MRN: [id number] DOB: 05/05/1975 48 y.o. PCP: Pcp, No  Date of Admission: 12/16/2023  9:27 AM Date of Discharge: 9/26 Attending Physician: Dr. Reyes Fenton  Discharge Diagnosis: 1. Principal Problem:   Opioid overdose, accidental or unintentional, initial encounter [facility]) Active Problems:   Elevated troponin   Drug reaction   Bipolar 1 disorder, mixed, moderate (HCC)   Demand ischemia of myocardium [facility])   Discharge Medications: Allergies as of 12/17/2023       Reactions   Tramadol  Nausea Only   Pt states it was 1 time intolerance. He just felt nauseous.        Medication List     TAKE these medications    acetaminophen  500 MG tablet Commonly known as: TYLENOL  Take 500-1,000 mg by mouth every 6 (six) hours as needed for moderate pain (pain score 4-6).   ibuprofen  600 MG tablet Commonly known as: ADVIL  Take 1 tablet (600 mg total) by mouth every 6 (six) hours as needed.   lidocaine  5 % Commonly known as: LIDODERM  Place 1 patch onto the skin daily. Remove & Discard patch within 12 hours or as directed by MD        Disposition and follow-up:   [name] was discharged from [facility] in Good condition.  At the hospital follow up visit please address:  Suspected cardiorespiratory arrest S/P accidental opioid overdose Patient found down and underwent 3 rounds of bystander CPR after unintentional overdose with unknown pill. Discharged with instructions to manage MSK pain with Ibuprofen , Tylenol , and Lidocaine  patches. Echo without concerning features. Leukocytosis likely secondary to CPR. Please evaluate for cardiac symptoms and treat MSK symptoms if still present. Reiterate Cardiology follow up on 10/14. Consider CBC without differential to assess resolution of leukocytosis.  Chronic lower back pain Patient with reported history of chronic lower back pain which prompted him to take an unknown pill. Please evaluate  and treat lower back pain and refer to specialty care if necessary  3. Establish Primary care Patient has not seen a PCP in 12 years and would like to establish care with the [facility] as this is where his son receives care. No known past medical history. 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 History of tobacco use but recently stopped. Lipid panel and A1C within normal limits. HIV negative. Unclear vaccination history. Lab evidence that he is not immune to Hepatitis B. Consider vaccination for this patient as well as other appropriate vaccines. Gonorrhea/Chlamydia test pending at time of discharge.  4.  Labs / imaging needed at time of follow-up: CBC  5.  Pending labs/ test needing follow-up: G/C urine  Follow-up Appointments:  Follow-up Information     Dunn, Dayna N, PA-C Follow up on 01/04/2024.   Specialties: Cardiology, Radiology Why: Follow up with Cardiology on 01/04/2024 at 10:05. Please arrive 15 minutes early Contact information: [address] [place] [address] [phone]         [name], MD Follow up on 12/24/2023.   Specialty: Family Medicine Why: Follow up with Family Medicine to establish care with their office. Appointment at 2:30 on 10/3. Please arrive 15 minutes early Contact information: [address] [place] [address] [phone]                  Hospital Course by problem list: Dean Palmer is a 48 y.o. person living with a history of chronic lower back pain who presented  after suspected cardiorespiratory arrest and CPR after suspected opioid overdose and admitted for observation due to rising troponins.  #Suspected Cardiac arrest S/P CPR #Elevated troponin #Leukocytosis Patient brought to the hospital after being found down at work after taking a pill that he received from a friend.  He suspects that this pill was an opioid and he was told that it was Roxicodone. He underwent bystander CPR in  the field for a reported loss of his pulses. Pulses were present and the patient had agonal breathing on EMS arrival.  He received Narcan from EMS with good response, and was reportedly back to his baseline by the time he arrived at the hospital. Troponins originally trending up but peaked at 157 then regressed to 91. Underwent 2 EKGs while admitted which showed NSR with no ischemic changes, .  Chest x-ray showed no acute process.  He had some tenderness over his anterior chest wall from where they did the CPR but has no shortness of breath or exertional symptoms. He had not seen a primary care doctor in 12 years. Due to troponins peaking at 151, suspect that the elevated troponins were more likely from CPR than a true cardiac arrest. Echocardiogram demonstrated normal EF with no wall motion abnormalities. Seen by cardiology in the ED who have ordered echocardiogram. A1C 5.0 and Lipid panel with LDL of 86. Received Toradol  and Lidocaine  patch for his MSK chest pain. Discharged with Lidocaine  patches and advised to manage symptoms with Ibuprofen  and Tylenol .   #Suspected opiate overdose Suspect that his cardiorespiratory arrest was due to accidental opioid overdose.  The patient suspected that this pill was Roxicodone.  His UDS shows that he was negative for opioids, however this would not show fentanyl or other synthetic opioids.  I suspect that a synthetic opioid was the cause of his arrest given his acute improvement with naloxone.   #Chronic lower back pain Normally manages symptoms with Ibuprofen  and Tylenol . Does not normally take opioids and this was reportedly his first time using someone else's pills. Will need further workup for this in the outpatient setting.  #Leukocytosis Initially with Leukocytosis to 12.0. Suspect that his leukocytosis is reactive to either his cardiopulmonary arrest or chest compressions.     #History of alcohol abuse #History of Bipolar 1 disorder Patient has had been  sober from alcohol for 2 years and recently quit smoking. Also has not been to a primary care doctor in 12 years.  He has not been taking anything for his bipolar disorder and it is possible that his impulsive behavior could be due to his underlying mental illness. Due to his drug use and history of alcoholism he was screened for HIV, Hepatitis, and STIs HIV negative, Hepatitis panel negative. G/C pending results. Patient is not immune to hepatitis B.       Subjective  Patient rates his anterior chest pain as 9/10, thinks Toradol  and lidocaine  patch helped him. Denies abdominal pain, headache, changes in vision, SOB, swelling in feet, nausea, or vomiting. He feels ready to go home.  Discharge Exam:   BP 137/68 (BP Location: Right Arm)   Pulse 68   Temp 98.3 F (36.8 C) (Oral)   Resp 16   Ht 6' 1.03 (1.855 m)   Wt 88.9 kg   SpO2 100%   BMI 25.82 kg/m  Discharge exam:  Physical Exam Constitutional:      General: He is not in acute distress.    Appearance: He is not ill-appearing.  HENT:  Head: Normocephalic.     Mouth/Throat:     Mouth: Mucous membranes are moist.  Eyes:     Extraocular Movements: Extraocular movements intact.  Cardiovascular:     Rate and Rhythm: Normal rate and regular rhythm.     Pulses: Normal pulses.     Heart sounds: No murmur heard.    No friction rub. No gallop.  Pulmonary:     Effort: Pulmonary effort is normal. No respiratory distress.     Breath sounds: No wheezing, rhonchi or rales.  Abdominal:     General: Abdomen is flat. There is no distension.     Tenderness: There is no abdominal tenderness.  Musculoskeletal:     Right lower leg: No edema.     Left lower leg: No edema.  Neurological:     General: No focal deficit present.     Mental Status: He is alert and oriented to person, place, and time.  Psychiatric:        Mood and Affect: Mood normal.      Pertinent Labs, Studies, and Procedures:     Latest Ref Rng & Units 12/16/2023    10:02 AM 12/01/2013   10:41 AM 07/21/2013    4:54 PM  CBC  WBC 4.0 - 10.5 K/uL 12.0  8.8  8.5   Hemoglobin 13.0 - 17.0 g/dL 86.5  84.7  85.9   Hematocrit 39.0 - 52.0 % 40.7  45.0  39.7   Platelets 150 - 400 K/uL 223  230.0  240        Latest Ref Rng & Units 12/16/2023   10:02 AM 12/01/2013   10:41 AM 10/12/2013    9:23 AM  CMP  Glucose 70 - 99 mg/dL 897  81  82   BUN 6 - 20 mg/dL 10  19  9    Creatinine 0.61 - 1.24 mg/dL 8.98  1.1  0.8   Sodium 135 - 145 mmol/L 140  136  139   Potassium 3.5 - 5.1 mmol/L 4.4  3.4  4.0   Chloride 98 - 111 mmol/L 107  96  106   CO2 22 - 32 mmol/L 24  31  29    Calcium 8.9 - 10.3 mg/dL 8.4  9.4  9.1   Total Protein 6.5 - 8.1 g/dL 6.7  8.1  7.2   Total Bilirubin 0.0 - 1.2 mg/dL 0.8  1.8  1.0   Alkaline Phos 38 - 126 U/L 49  41  42   AST 15 - 41 U/L 19  32  19   ALT 0 - 44 U/L 12  29  18      ECHOCARDIOGRAM COMPLETE Result Date: 12/17/2023    ECHOCARDIOGRAM REPORT   Patient Name:   Dean Palmer Date of Exam: 12/17/2023 Medical Rec #:  [id number]      Height:       73.0 in Accession #:    [id number]     Weight:       195.9 lb Date of Birth:  1975-07-12     BSA:          2.134 m Patient Age:    47 years       BP:           111/70 mmHg Patient Gender: M              HR:           73 bpm. Exam Location:  Inpatient Procedure: 2D Echo (Both Spectral and Color  Flow Doppler were utilized during            procedure). Indications:    Elevated Troponin  History:        Patient has no prior history of Echocardiogram examinations.                 Signs/Symptoms:Syncope. Elevated Troponin.  Sonographer:    Norleen Amour Referring Phys: [id number] TAYLOR A PARCELLS IMPRESSIONS  1. Left ventricular ejection fraction, by estimation, is 60 to 65%. The left ventricle has normal function. The left ventricle has no regional wall motion abnormalities. The left ventricular internal cavity size was mildly dilated. Left ventricular diastolic parameters were normal.  2. Right ventricular  systolic function is normal. The right ventricular size is normal.  3. The mitral valve is normal in structure. No evidence of mitral valve regurgitation. No evidence of mitral stenosis.  4. The aortic valve is normal in structure. Aortic valve regurgitation is not visualized. No aortic stenosis is present.  5. The inferior vena cava is normal in size with greater than 50% respiratory variability, suggesting right atrial pressure of 3 mmHg. FINDINGS  Left Ventricle: Left ventricular ejection fraction, by estimation, is 60 to 65%. The left ventricle has normal function. The left ventricle has no regional wall motion abnormalities. The left ventricular internal cavity size was mildly dilated. There is  no left ventricular hypertrophy. Left ventricular diastolic parameters were normal. Right Ventricle: The right ventricular size is normal. No increase in right ventricular wall thickness. Right ventricular systolic function is normal. Left Atrium: Left atrial size was normal in size. Right Atrium: Right atrial size was normal in size. Pericardium: There is no evidence of pericardial effusion. Mitral Valve: The mitral valve is normal in structure. No evidence of mitral valve regurgitation. No evidence of mitral valve stenosis. Tricuspid Valve: The tricuspid valve is normal in structure. Tricuspid valve regurgitation is not demonstrated. No evidence of tricuspid stenosis. Aortic Valve: The aortic valve is normal in structure. Aortic valve regurgitation is not visualized. No aortic stenosis is present. Pulmonic Valve: The pulmonic valve was normal in structure. Pulmonic valve regurgitation is not visualized. No evidence of pulmonic stenosis. Aorta: The aortic root is normal in size and structure. Venous: The inferior vena cava is normal in size with greater than 50% respiratory variability, suggesting right atrial pressure of 3 mmHg. IAS/Shunts: No atrial level shunt detected by color flow Doppler.  LEFT VENTRICLE PLAX 2D  LVIDd:         5.60 cm      Diastology LVIDs:         3.30 cm      LV e' medial:    8.92 cm/s LV PW:         0.80 cm      LV E/e' medial:  7.2 LV IVS:        0.80 cm      LV e' lateral:   9.14 cm/s LVOT diam:     2.30 cm      LV E/e' lateral: 7.0 LV SV:         81 LV SV Index:   38 LVOT Area:     4.15 cm  LV Volumes (MOD) LV vol d, MOD A2C: 118.0 ml LV vol d, MOD A4C: 107.0 ml LV vol s, MOD A2C: 40.0 ml LV vol s, MOD A4C: 33.9 ml LV SV MOD A2C:     78.0 ml LV SV MOD A4C:     107.0 ml  LV SV MOD BP:      80.6 ml RIGHT VENTRICLE             IVC RV Basal diam:  3.10 cm     IVC diam: 1.00 cm RV S prime:     14.30 cm/s TAPSE (M-mode): 3.0 cm LEFT ATRIUM             Index        RIGHT ATRIUM           Index LA diam:        2.80 cm 1.31 cm/m   RA Area:     16.90 cm LA Vol (A2C):   39.4 ml 18.47 ml/m  RA Volume:   43.60 ml  20.43 ml/m LA Vol (A4C):   44.1 ml 20.67 ml/m LA Biplane Vol: 41.8 ml 19.59 ml/m  AORTIC VALVE             PULMONIC VALVE LVOT Vmax:   93.50 cm/s  PV Vmax:       1.00 m/s LVOT Vmean:  64.900 cm/s PV Peak grad:  4.0 mmHg LVOT VTI:    0.196 m  AORTA Ao Root diam: 3.80 cm Ao Asc diam:  3.20 cm MITRAL VALVE MV Area (PHT): 2.76 cm    SHUNTS MV Decel Time: 275 msec    Systemic VTI:  0.20 m MV E velocity: 64.40 cm/s  Systemic Diam: 2.30 cm MV A velocity: 55.00 cm/s MV E/A ratio:  1.17 Oneil Parchment MD Electronically signed by Oneil Parchment MD Signature Date/Time: 12/17/2023/1:25:29 PM    Final    DG Chest 2 View Result Date: 12/16/2023 EXAM: 2 VIEW(S) XRAY OF THE CHEST 12/16/2023 10:12:00 AM COMPARISON: 04/14/2011 CLINICAL HISTORY: Chest pain, post-CPR. Post CPR, Overdose. FINDINGS: LINES, TUBES AND DEVICES: Telemetry leads noted. LUNGS AND PLEURA: No focal pulmonary opacity. No pulmonary edema. No pleural effusion. No pneumothorax. HEART AND MEDIASTINUM: No acute abnormality of the cardiac and mediastinal silhouettes. BONES AND SOFT TISSUES: No acute osseous abnormality. IMPRESSION: 1. Normal chest  radiograph without acute cardiopulmonary process. Electronically signed by: Waddell Calk MD 12/16/2023 11:04 AM EDT RP Workstation: [id number]     Discharge Instructions: Discharge Instructions     Call MD for:  difficulty breathing, headache or visual disturbances   Complete by: As directed    Call MD for:  persistant dizziness or light-headedness   Complete by: As directed    Call MD for:  severe uncontrolled pain   Complete by: As directed    Diet - low sodium heart healthy   Complete by: As directed    Discharge instructions   Complete by: As directed    You were hospitalized for opioid overdose and cardiac arrest. Thank you for allowing us  to be part of your care.   Please follow up with Family Medicine and Cardiology as documented elsewhere.  Please note these changes made to your medications:  You can take Tylenol  up to 1000mg  every 6 hours and Ibuprofen  600mg  every 6 hours for your pain. I have also ordered Lidocaine  patches for your chest pain.  If you start to have worsening chest pain along with shortness of breath, or chest pain that worsens with activity, please return to the emergency for further evaluation.   Increase activity slowly   Complete by: As directed        Signed: Napoleon Limes, MD 12/17/2023, 2:02 PM

## 2023-12-17 NOTE — Progress Notes (Signed)
 2007- Patient c/o chest pain 10/10, pressure-like and constant. Denies nausea and vomiting, denies dizziness. 12 lead EKG performed, no acute changes compared to prior EKG. Lidocaine  patch applied and Tylenol  given per MAR, no relief noted. RN paged MD on call at 2300 for further management. Pt remains alert and oriented, VS stable. 2309- MD ordered 15 mg IV for chest pain. Upon entering room to administer, patient observed asleep in bed, snoring, respirations even and unlabored. Medication held at this time. Will reassess pain and administer as appropriate when patient is awake. MD aware and to be notified if pain persists.

## 2023-12-17 NOTE — Progress Notes (Signed)
 Urine sample collected and sent to lab today at 0930.

## 2023-12-17 NOTE — TOC Initial Note (Addendum)
 Transition of Care [facility]) - Initial/Assessment Note    Patient Details  Name: Dean Palmer MRN: [id number] Date of Birth: September 17, 1975  Transition of Care [facility]) CM/SW Contact:    Isaiah Public, LCSWA Phone Number: 12/17/2023, 12:25 PM  Clinical Narrative:                    CSW spoke with patient at bedside. Patient reports he lives at home with spouse and son. Patient informed CSW his plan at dc is to return home. Patient reports his friend will pick him up when he is medically ready for discharge.All questions answered. No further questions reported at this time.     Patient Goals and CMS Choice            Expected Discharge Plan and Services                                              Prior Living Arrangements/Services                       Activities of Daily Living   ADL Screening (condition at time of admission) Independently performs ADLs?: Yes (appropriate for developmental age) Is the patient deaf or have difficulty hearing?: No Does the patient have difficulty seeing, even when wearing glasses/contacts?: No Does the patient have difficulty concentrating, remembering, or making decisions?: No  Permission Sought/Granted                  Emotional Assessment              Admission diagnosis:  Elevated troponin [R79.89] Accidental overdose, initial encounter [T50.901A] Opioid overdose, accidental or unintentional, initial encounter [facility]) [T40.2X1A] Patient Active Problem List   Diagnosis Date Noted   Demand ischemia of myocardium (HCC) 12/17/2023   Elevated troponin 12/16/2023   Drug reaction 12/16/2023   Opioid overdose, accidental or unintentional, initial encounter (HCC) 12/16/2023   Bipolar 1 disorder, mixed, moderate (HCC) 12/16/2023   Oral infection 12/27/2013   Nausea with vomiting 12/01/2013   Abdominal pain 07/21/2013   Acute bronchitis 08/09/2012   Viral URI with cough 08/05/2012   Bipolar disorder (HCC)  04/12/2012   Flu 04/08/2011   Vitamin D deficiency 06/10/2009   BACK PAIN WITH RADICULOPATHY 05/31/2009   FATIGUE 05/31/2009   NUMBNESS, HAND 05/31/2009   SNORING 05/31/2009   PCP:  Pcp, No Pharmacy:   [id number] [address], [place] - [address] AT [facility] [address] [place] [address] Phone: [phone] Fax: [phone]  [facility] [address] [place] [address] Phone: [phone] Fax: [phone]     Social Drivers of Health (SDOH) Social History: SDOH Screenings   Food Insecurity: Patient Declined (12/16/2023)  Housing: Patient Declined (12/16/2023)  Transportation Needs: Patient Declined (12/16/2023)  Utilities: Patient Declined (12/16/2023)  Social Connections: Patient Declined (12/16/2023)  Tobacco Use: Medium Risk (12/16/2023)   SDOH Interventions:     Readmission Risk Interventions     No data to display

## 2023-12-17 NOTE — Hospital Course (Addendum)
#  Suspected Cardiac arrest S/P CPR #Elevated troponin #Leukocytosis Patient brought to the hospital after being found down at work after taking a pill that he received from a friend.  He suspects that this pill was an opioid and he was told that it was Roxicodone. He underwent bystander CPR in the field for a reported loss of his pulses. Pulses were present and the patient had agonal breathing on EMS arrival.  He received Narcan from EMS with good response, and was reportedly back to his baseline by the time he arrived at the hospital. Troponins originally uptrending but peaked at 157 before regression to 91. Underwent 2 EKGs while admitted which showed NSR with no ischemic changes. Chest x-ray showed no acute process.  He had some tenderness over his anterior chest wall from where they did the CPR but has no shortness of breath or exertional symptoms. He had not seen a primary care doctor in 12 years. Due to troponins peaking at 151, suspect that the elevated troponins were more likely from CPR than a true cardiac arrest. Echocardiogram demonstrated normal EF with no wall motion abnormalities. Seen by cardiology in the ED who have ordered echocardiogram. A1C 5.0 and Lipid panel with LDL of 86. Received Toradol  and Lidocaine  patch for his MSK chest pain. Discharged with Lidocaine  patches and advised to manage symptoms with Ibuprofen  and Tylenol .   #Suspected opiate overdose Suspect that his cardiorespiratory arrest was due to accidental opioid overdose.  The patient suspected that this pill was Roxicodone.  His UDS shows that he was negative for opioids, however this would not show fentanyl or other synthetic opioids.  I suspect that a synthetic opioid was the cause of his arrest given his acute improvement with naloxone.   #Chronic lower back pain Normally manages symptoms with Ibuprofen  and Tylenol . Does not normally take opioids and this was reportedly his first time using someone else's pills. Will need  further workup for this in the outpatient setting.  #Leukocytosis Initially with Leukocytosis to 12.0. Suspect that his leukocytosis is reactive to either his cardiopulmonary arrest or chest compressions.     #History of alcohol abuse #History of Bipolar 1 disorder Patient has had been sober from alcohol for 2 years and recently quit smoking. Also has not been to a primary care doctor in 12 years.  He has not been taking anything for his bipolar disorder and it is possible that his impulsive behavior could be due to his underlying mental illness. Due to his drug use and history of alcoholism he was screened for HIV, Hepatitis, and STIs HIV negative, Hepatitis panel negative. G/C pending results. Patient is not immune to hepatitis B.

## 2023-12-17 NOTE — Progress Notes (Signed)
 DAILY PROGRESS NOTE   Patient Name: Dean Palmer Date of Encounter: 12/17/2023 Cardiologist: None  Chief Complaint   Chest wall pain  Patient Profile   Dean Palmer is a 48 y.o. male with a hx of GERD, bipolar 1, anxiety, depression, history of alcoholism (quit 2 years ago), prior tobacco use recently quit, DDD,  who is being seen 12/16/2023 for the evaluation of elevated troponin at the request of Dr. Freddi.   Subjective   Some chest wall soreness overnight. Scheduled for echo today.  Vitals stable. Troponin peaked at 157, then declined to 91. Cholesterol is reasonable with LDL 86, slightly low HDL at 39, normal trigs. A1c 5.0%. UDS only positive for THC. Not clear what was in the pill that may have made him unresponsive - supposedly roxidocone - was given narcan by EMS.  Objective   Vitals:   12/16/23 2004 12/17/23 0012 12/17/23 0452 12/17/23 0719  BP: 122/75 (!) 112/54 113/63 111/70  Pulse:  70  72  Resp: 18 20 16 18   Temp: 99.4 F (37.4 C) 99.1 F (37.3 C) 99.6 F (37.6 C) 98.3 F (36.8 C)  TempSrc: Oral Oral Oral Oral  SpO2:  98% 97% 100%  Weight:      Height:        Intake/Output Summary (Last 24 hours) at 12/17/2023 1053 Last data filed at 12/17/2023 1000 Gross per 24 hour  Intake 720 ml  Output --  Net 720 ml   Filed Weights   12/16/23 0936 12/16/23 1500  Weight: 90.7 kg 88.9 kg    Physical Exam   General appearance: alert and no distress Lungs: clear to auscultation bilaterally Heart: regular rate and rhythm Extremities: extremities normal, atraumatic, no cyanosis or edema Neurologic: Grossly normal  Inpatient Medications    Scheduled Meds:  lidocaine   1 patch Transdermal Q24H    Continuous Infusions:   PRN Meds: acetaminophen  **OR** acetaminophen , senna-docusate   Labs   Results for orders placed or performed during the hospital encounter of 12/16/23 (from the past 48 hours)  CBC with Differential     Status: Abnormal    Collection Time: 12/16/23 10:02 AM  Result Value Ref Range   WBC 12.0 (H) 4.0 - 10.5 K/uL   RBC 4.73 4.22 - 5.81 MIL/uL   Hemoglobin 13.4 13.0 - 17.0 g/dL   HCT 59.2 60.9 - 47.9 %   MCV 86.0 80.0 - 100.0 fL   MCH 28.3 26.0 - 34.0 pg   MCHC 32.9 30.0 - 36.0 g/dL   RDW 86.6 88.4 - 84.4 %   Platelets 223 150 - 400 K/uL   nRBC 0.0 0.0 - 0.2 %   Neutrophils Relative % 80 %   Neutro Abs 9.8 (H) 1.7 - 7.7 K/uL   Lymphocytes Relative 10 %   Lymphs Abs 1.2 0.7 - 4.0 K/uL   Monocytes Relative 6 %   Monocytes Absolute 0.7 0.1 - 1.0 K/uL   Eosinophils Relative 1 %   Eosinophils Absolute 0.1 0.0 - 0.5 K/uL   Basophils Relative 1 %   Basophils Absolute 0.1 0.0 - 0.1 K/uL   Immature Granulocytes 2 %   Abs Immature Granulocytes 0.20 (H) 0.00 - 0.07 K/uL    Comment: Performed at [facility] Lab, [address], [place], [place] [address]  Comprehensive metabolic panel     Status: Abnormal   Collection Time: 12/16/23 10:02 AM  Result Value Ref Range   Sodium 140 135 - 145 mmol/L  Potassium 4.4 3.5 - 5.1 mmol/L   Chloride 107 98 - 111 mmol/L   CO2 24 22 - 32 mmol/L   Glucose, Bld 102 (H) 70 - 99 mg/dL    Comment: Glucose reference range applies only to samples taken after fasting for at least 8 hours.   BUN 10 6 - 20 mg/dL   Creatinine, Ser 8.98 0.61 - 1.24 mg/dL   Calcium 8.4 (L) 8.9 - 10.3 mg/dL   Total Protein 6.7 6.5 - 8.1 g/dL   Albumin 3.6 3.5 - 5.0 g/dL   AST 19 15 - 41 U/L   ALT 12 0 - 44 U/L   Alkaline Phosphatase 49 38 - 126 U/L   Total Bilirubin 0.8 0.0 - 1.2 mg/dL   GFR, Estimated >39 >39 mL/min    Comment: (NOTE) Calculated using the CKD-EPI Creatinine Equation (2021)    Anion gap 9 5 - 15    Comment: Performed at [facility] Lab, [address] [place], [place] [address]  Troponin I (High Sensitivity)     Status: Abnormal   Collection Time: 12/16/23 10:02 AM  Result Value Ref Range   Troponin I (High Sensitivity) 50 (H) <18 ng/L    Comment: (NOTE) Elevated  high sensitivity troponin I (hsTnI) values and significant  changes across serial measurements may suggest ACS but many other  chronic and acute conditions are known to elevate hsTnI results.  Refer to the Links section for chest pain algorithms and additional  guidance. Performed at [facility], [address], [place], [place] [address]   Troponin I (High Sensitivity)     Status: Abnormal   Collection Time: 12/16/23 11:07 AM  Result Value Ref Range   Troponin I (High Sensitivity) 91 (H) <18 ng/L    Comment: RESULT CALLED TO, READ BACK BY AND VERIFIED WITH MYRTIS HOPPING, RN 1156 12/16/23 L.KLAR (NOTE) Elevated high sensitivity troponin I (hsTnI) values and significant  changes across serial measurements may suggest ACS but many other  chronic and acute conditions are known to elevate hsTnI results.  Refer to the Links section for chest pain algorithms and additional  guidance. Performed at [facility] Lab, [address] [place], [place] [address]   Troponin I (High Sensitivity)     Status: Abnormal   Collection Time: 12/16/23 12:48 PM  Result Value Ref Range   Troponin I (High Sensitivity) 111 (HH) <18 ng/L    Comment: CRITICAL RESULT CALLED TO, READ BACK BY AND VERIFIED WITH MYRTIS HOPPING, RN 1424 12/16/23 L. KLAR (NOTE) Elevated high sensitivity troponin I (hsTnI) values and significant  changes across serial measurements may suggest ACS but many other  chronic and acute conditions are known to elevate hsTnI results.  Refer to the Links section for chest pain algorithms and additional  guidance. Performed at [facility], [address] [place], [place] [address]   Urinalysis, Routine w reflex microscopic -Urine, Clean Catch     Status: None   Collection Time: 12/16/23  2:04 PM  Result Value Ref Range   Color, Urine YELLOW YELLOW   APPearance CLEAR CLEAR   Specific Gravity, Urine 1.018 1.005 - 1.030   pH 5.0 5.0 - 8.0   Glucose, UA NEGATIVE NEGATIVE  mg/dL   Hgb urine dipstick NEGATIVE NEGATIVE   Bilirubin Urine NEGATIVE NEGATIVE   Ketones, ur NEGATIVE NEGATIVE mg/dL   Protein, ur NEGATIVE NEGATIVE mg/dL   Nitrite NEGATIVE NEGATIVE   Leukocytes,Ua NEGATIVE NEGATIVE  Comment: Performed at [facility] Lab, [address] [place], [place] [address]  Rapid urine drug screen (hospital performed)     Status: Abnormal   Collection Time: 12/16/23  2:04 PM  Result Value Ref Range   Opiates NONE DETECTED NONE DETECTED   Cocaine NONE DETECTED NONE DETECTED   Benzodiazepines NONE DETECTED NONE DETECTED   Amphetamines NONE DETECTED NONE DETECTED   Tetrahydrocannabinol POSITIVE (A) NONE DETECTED   Barbiturates NONE DETECTED NONE DETECTED    Comment: (NOTE) DRUG SCREEN FOR MEDICAL PURPOSES ONLY.  IF CONFIRMATION IS NEEDED FOR ANY PURPOSE, NOTIFY LAB WITHIN 5 DAYS.  LOWEST DETECTABLE LIMITS FOR URINE DRUG SCREEN Drug Class                     Cutoff (ng/mL) Amphetamine and metabolites    1000 Barbiturate and metabolites    200 Benzodiazepine                 200 Opiates and metabolites        300 Cocaine and metabolites        300 THC                            50 Performed at [facility], [address] [place], [place] [address]   Troponin I (High Sensitivity)     Status: Abnormal   Collection Time: 12/16/23  3:30 PM  Result Value Ref Range   Troponin I (High Sensitivity) 157 (HH) <18 ng/L    Comment: CRITICAL VALUE NOTED. VALUE IS CONSISTENT WITH PREVIOUSLY REPORTED/CALLED VALUE (NOTE) Elevated high sensitivity troponin I (hsTnI) values and significant  changes across serial measurements may suggest ACS but many other  chronic and acute conditions are known to elevate hsTnI results.  Refer to the Links section for chest pain algorithms and additional  guidance. Performed at [facility] Lab, [address], [place], [place] [address]   Troponin I (High Sensitivity)     Status: Abnormal   Collection Time:  12/16/23  6:40 PM  Result Value Ref Range   Troponin I (High Sensitivity) 91 (H) <18 ng/L    Comment: (NOTE) Elevated high sensitivity troponin I (hsTnI) values and significant  changes across serial measurements may suggest ACS but many other  chronic and acute conditions are known to elevate hsTnI results.  Refer to the Links section for chest pain algorithms and additional  guidance. Performed at [facility] Lab, [address] [place], [place] [address]   HIV Antibody (routine testing w rflx)     Status: None   Collection Time: 12/16/23  6:45 PM  Result Value Ref Range   HIV Screen 4th Generation wRfx Non Reactive Non Reactive    Comment: Performed at [facility], [address], [place], [place] [address]  Hepatitis panel, acute     Status: None   Collection Time: 12/16/23  6:45 PM  Result Value Ref Range   Hepatitis B Surface Ag NON REACTIVE NON REACTIVE   HCV Ab NON REACTIVE NON REACTIVE    Comment: (NOTE) Nonreactive HCV antibody screen is consistent with no HCV infections,  unless recent infection is suspected or other evidence exists to indicate HCV infection.     Hep A IgM NON REACTIVE NON REACTIVE   Hep B C IgM NON REACTIVE NON REACTIVE    Comment: Performed at [facility] Lab, [address], [place], [place] [address]  Hepatitis B surface antibody,qualitative     Status: None   Collection Time: 12/16/23  6:45 PM  Result Value Ref Range   Hep B S Ab NON REACTIVE NON REACTIVE    Comment: (NOTE) Inconsistent with immunity, less than 10 mIU/mL.  Performed at [facility] Lab, [address] [place], [place] [address]   Lipid panel     Status: Abnormal   Collection Time: 12/17/23  4:46 AM  Result Value Ref Range   Cholesterol 133 0 - 200 mg/dL   Triglycerides 42 <849 mg/dL   HDL 39 (L) >59 mg/dL   Total CHOL/HDL Ratio 3.4 RATIO   VLDL 8 0 - 40 mg/dL   LDL Cholesterol 86 0 - 99 mg/dL    Comment:        Total Cholesterol/HDL:CHD  Risk Coronary Heart Disease Risk Table                     Men   Women  1/2 Average Risk   3.4   3.3  Average Risk       5.0   4.4  2 X Average Risk   9.6   7.1  3 X Average Risk  23.4   11.0        Use the calculated Patient Ratio above and the CHD Risk Table to determine the patient's CHD Risk.        ATP III CLASSIFICATION (LDL):  <100     mg/dL   Optimal  899-870  mg/dL   Near or Above                    Optimal  130-159  mg/dL   Borderline  839-810  mg/dL   High  >809     mg/dL   Very High Performed at [facility], [address] [place], [place] [address]   Hemoglobin A1c     Status: None   Collection Time: 12/17/23  4:46 AM  Result Value Ref Range   Hgb A1c MFr Bld 5.0 4.8 - 5.6 %    Comment: (NOTE) Diagnosis of Diabetes The following HbA1c ranges recommended by the American Diabetes Association (ADA) may be used as an aid in the diagnosis of diabetes mellitus.  Hemoglobin             Suggested A1C NGSP%              Diagnosis  <5.7                   Non Diabetic  5.7-6.4                Pre-Diabetic  >6.4                   Diabetic  <7.0                   Glycemic control for                       adults with diabetes.     Mean Plasma Glucose 96.8 mg/dL    Comment: Performed at [facility], [address] [place], [place] [address]    ECG   N/A  Telemetry   Normal sinus rhythm - Personally Reviewed  Radiology    DG Chest 2 View Result Date: 12/16/2023 EXAM: 2 VIEW(S) XRAY OF THE CHEST 12/16/2023  10:12:00 AM COMPARISON: 04/14/2011 CLINICAL HISTORY: Chest pain, post-CPR. Post CPR, Overdose. FINDINGS: LINES, TUBES AND DEVICES: Telemetry leads noted. LUNGS AND PLEURA: No focal pulmonary opacity. No pulmonary edema. No pleural effusion. No pneumothorax. HEART AND MEDIASTINUM: No acute abnormality of the cardiac and mediastinal silhouettes. BONES AND SOFT TISSUES: No acute osseous abnormality. IMPRESSION: 1. Normal chest radiograph without  acute cardiopulmonary process. Electronically signed by: Waddell Calk MD 12/16/2023 11:04 AM EDT RP Workstation: [id number]    Cardiac Studies   Echo pending - in progress  Assessment   Principal Problem:   Opioid overdose, accidental or unintentional, initial encounter [facility]) Active Problems:   Elevated troponin   Drug reaction   Bipolar 1 disorder, mixed, moderate (HCC)   Plan   No complaints overnight- mild chest wall tenderness. Troponin declining. Echo in progress - brief review shows grossly preserved LVEF without obvious WMA's - full report pending completion of the study. If the echo is otherwise unremarkable, could be discharged home today from a cardiac standpoint with outpatient cardiac follow-up to be arranged.  Time Spent Directly with Patient:  I have spent a total of 25 minutes with the patient reviewing hospital notes, telemetry, EKGs, labs and examining the patient as well as establishing an assessment and plan that was discussed personally with the patient.  > 50% of time was spent in direct patient care.  Length of Stay:  LOS: 0 days   Vinie KYM Maxcy, MD, [facility], FNLA, FACP  [place]  [facility]  Medical Director of the Advanced Lipid Disorders &  Cardiovascular Risk Reduction Clinic Diplomate of the American Board of Clinical Lipidology Attending Cardiologist  Direct Dial: [phone]  Fax: [phone]  Website:  [email or web address]  Vinie JAYSON Maxcy 12/17/2023, 10:53 AM

## 2023-12-17 NOTE — Progress Notes (Signed)
  Echocardiogram 2D Echocardiogram has been performed.  Norleen ORN [facility] 12/17/2023, 11:31 AM

## 2023-12-17 NOTE — Progress Notes (Signed)
  Echocardiogram 2D Echocardiogram has been performed.  Norleen ORN [facility] 12/17/2023, 11:46 AM

## 2023-12-17 NOTE — Plan of Care (Signed)

## 2023-12-20 LAB — GC/CHLAMYDIA PROBE AMP (~~LOC~~) NOT AT ARMC
Chlamydia: NEGATIVE
Comment: NEGATIVE
Comment: NORMAL
Neisseria Gonorrhea: NEGATIVE

## 2023-12-23 NOTE — Progress Notes (Signed)
     SUBJECTIVE:   CHIEF COMPLAINT / HPI:   Dean Palmer presents today for hospital follow up.   Hospitalized at [facility] from 9/25 to 12/16/2023 for suspected unintentional overdose. He had taken an unknown pill to help with his chronic lower back pain (told it was Roxicodone). Responded well to Narcan by EMS. He was found down and had 3 rounds of bystander CPR. UDS positive for THC (no opiates detected). Troponin elevated, suspected in setting of CPR. Echo done inpatient without concerning features. MSK pain post-CPR managed with ibuprofen , tylenol , and lidocaine . Cardiology follow-up scheduled 01/04/24.  Since discharge, patient reports ***  Back Pain ***  Healthcare Maintenance: - Flu shot - Pneumococcal vaccine - Hep B Vaccines - COVID booster - Tdap booster - Colonoscopy - Hep C screening  PERTINENT  PMH / PSH: Back pain, Vitamin D deficiency, ***bipolar 1  OBJECTIVE:   There were no vitals taken for this visit.  ***  ASSESSMENT/PLAN:   Assessment & Plan     Follow-up for annual exam.  Alan Flies, MD [facility]

## 2023-12-24 ENCOUNTER — Ambulatory Visit: Payer: Self-pay

## 2023-12-24 VITALS — BP 111/70 | HR 81 | Ht 74.0 in | Wt 188.4 lb

## 2023-12-24 DIAGNOSIS — D72829 Elevated white blood cell count, unspecified: Secondary | ICD-10-CM | POA: Diagnosis not present

## 2023-12-24 DIAGNOSIS — M545 Low back pain, unspecified: Secondary | ICD-10-CM | POA: Diagnosis not present

## 2023-12-24 DIAGNOSIS — E559 Vitamin D deficiency, unspecified: Secondary | ICD-10-CM | POA: Diagnosis not present

## 2023-12-24 DIAGNOSIS — R0789 Other chest pain: Secondary | ICD-10-CM

## 2023-12-24 MED ORDER — NAPROXEN 500 MG PO TABS: 500.0000 mg | ORAL_TABLET | Freq: Two times a day (BID) | ORAL | 0 refills | Status: AC | PRN

## 2023-12-24 NOTE — Patient Instructions (Addendum)
 Dean Palmer [place],   It was great seeing you in clinic today! You came in for follow-up of recent hospitalization.  I am glad you are doing better.  I am going to add a medication called naproxen  to take 500 mg up to 2 times a day (take with a meal) for pain.  Do NOT take the ibuprofen  if you are taking naproxen .  There are a few things for you to do outside of clinic: -I am doing some lab work; results will be available in MyChart, and I will reach out if anything is abnormal - I have sent in a referral to physical therapy; you should get a call from them in the next couple of weeks to schedule this appointment - I have also provided back exercises (stretching and strengthening) to do regularly - Please make a follow-up visit to see me back at the end of the month for your annual physical exam - Once you have your insurance situation figured out, please let me know so I can refer you for a colonoscopy - For information on therapists, please go to [email or web address]. You can also contact your insurance company to find an in-network therapist.  Please bring all your medications to your next office visit.  Thank you for allowing me to be a part of your care team! Alan Flies, MD [facility] [address] [place], [place], [place] [address] [phone]

## 2023-12-24 NOTE — Assessment & Plan Note (Addendum)
 Hx of same per chart; last documented 22 in 05/2009. - Will reassess Vit D level today

## 2023-12-24 NOTE — Assessment & Plan Note (Addendum)
 Based on description of pain, suspicious for slipped disc. No red flag symptoms, no current significant pain. - Will prescribe Naproxen  500 mg BID PRN for significant pain; instructed patient not to take this and ibuprofen  together - Back exercises handout provided - Referral placed to physical therapy - Consider spinal imaging (CT vs MRI) once insurance situation sorted

## 2023-12-25 LAB — CBC WITH DIFFERENTIAL/PLATELET
Basophils Absolute: 0.1 x10E3/uL (ref 0.0–0.2)
Basos: 1 %
EOS (ABSOLUTE): 0.1 x10E3/uL (ref 0.0–0.4)
Eos: 2 %
Hematocrit: 46.6 % (ref 37.5–51.0)
Hemoglobin: 15.3 g/dL (ref 13.0–17.7)
Immature Grans (Abs): 0 x10E3/uL (ref 0.0–0.1)
Immature Granulocytes: 0 %
Lymphocytes Absolute: 1.3 x10E3/uL (ref 0.7–3.1)
Lymphs: 31 %
MCH: 28.1 pg (ref 26.6–33.0)
MCHC: 32.8 g/dL (ref 31.5–35.7)
MCV: 86 fL (ref 79–97)
Monocytes Absolute: 0.6 x10E3/uL (ref 0.1–0.9)
Monocytes: 15 %
Neutrophils Absolute: 2.2 x10E3/uL (ref 1.4–7.0)
Neutrophils: 51 %
Platelets: 263 x10E3/uL (ref 150–450)
RBC: 5.44 x10E6/uL (ref 4.14–5.80)
RDW: 13.1 % (ref 11.6–15.4)
WBC: 4.2 x10E3/uL (ref 3.4–10.8)

## 2023-12-25 LAB — VITAMIN D 25 HYDROXY (VIT D DEFICIENCY, FRACTURES): Vit D, 25-Hydroxy: 23.2 ng/mL — ABNORMAL LOW (ref 30.0–100.0)

## 2023-12-27 ENCOUNTER — Ambulatory Visit: Payer: Self-pay

## 2023-12-27 DIAGNOSIS — E559 Vitamin D deficiency, unspecified: Secondary | ICD-10-CM

## 2023-12-27 MED ORDER — VITAMIN D (CHOLECALCIFEROL) 25 MCG (1000 UT) PO TABS: 1.0000 | ORAL_TABLET | Freq: Every day | ORAL | Status: AC

## 2023-12-27 NOTE — Progress Notes (Signed)
 Called patient to discuss labs; recommended starting OTC Vitamin D at 1000u daily or every other day. Otherwise white count improved, no further intervention needed.  Patient is planning to call insurance this afternoon to make sure it is active/working.

## 2024-01-03 NOTE — Progress Notes (Unsigned)
 Cardiology Office Note    Date:  01/04/2024  ID:  [name], DOB [birth year], MRN [id number] PCP:  [name], MD  Cardiologist:  Vinie JAYSON Maxcy, MD  Electrophysiologist:  None   Chief Complaint:   History of Present Illness: .    Dean Palmer is a 48 y.o. male with visit-pertinent history of GERD, bipolar 1, anxiety, depression, history of alcoholism (quit 2 years ago), prior tobacco use recently quit, DDD seen for post-hospital follow-up. He was recently admitted 12/16/23 with episode of unresponsiveness//possible cardiorespiratory arrest improved with Narcan suspected to be due to synthetic opioids that would not show up on UDS. Per consult note,  He presented to the [facility] emergency department on 12/16/2023 via EMS after being found down and unresponsive at work.  EMT reported that upon arrival to the scene he was pulseless, apneic, CPR was started 3 total rounds were completed.  EMS then arrived and noted patient to have good pulses, agonal breaths.  2.5 mg of Narcan was administered.  Patient reports that he had taken a little blue pill from someone at work, unaware what drug it is. ED notes indicate he became responsive once Narcan was adminstered. Troponins were elevated to a peak 157. 2D echo showed EF 60-65%, no significant abnormalities, recommended for ouptatient followup.   He is seen back for follow-up doing well. He has had some chest pain in the form of chest wall soreness in the setting of s/p CPR but no overt angina or dyspnea. No syncope. Mother had stent in her 38s, also has afib. 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  Labwork independently reviewed: 12/2023 CBC OK 11/2023 LDL 86, trig 42, K 4.4, Cr 1.01, LFTs ok 2015 TSH  ROS: .    Please see the history of present illness. All other systems are reviewed and otherwise  negative.  Studies Reviewed: SABRA    EKG:  EKG is not ordered today  CV Studies: Cardiac studies reviewed are outlined and summarized above. Otherwise please see EMR for full report.   Current Reported Medications:.    Current Meds  Medication Sig   acetaminophen  (TYLENOL ) 500 MG tablet Take 500-1,000 mg by mouth every 6 (six) hours as needed for moderate pain (pain score 4-6).   ibuprofen  (ADVIL ,MOTRIN ) 600 MG tablet Take 1 tablet (600 mg total) by mouth every 6 (six) hours as needed.   lidocaine  (LIDODERM ) 5 % Place 1 patch onto the skin daily. Remove & Discard patch within 12 hours or as directed by MD   naproxen  (NAPROSYN ) 500 MG tablet Take 1 tablet (500 mg total) by mouth 2 (two) times daily as needed. Take with meals   Vitamin D, Cholecalciferol, 25 MCG (1000 UT) TABS Take 1 tablet by mouth daily.    Physical Exam:    VS:  BP 112/70 (BP Location: Left Arm, Patient Position: Sitting, Cuff Size: Normal)   Pulse 68   Ht 6' 2 (1.88 m)   Wt 190 lb (86.2 kg)   SpO2 98%   BMI 24.39 kg/m    Wt Readings from Last 3 Encounters:  01/04/24 190 lb (86.2 kg)  12/24/23 188 lb 6.4 oz (85.5 kg)  12/16/23 195 lb 14.4 oz (88.9 kg)    GEN: Well nourished, well developed in no acute distress NECK: No JVD; No carotid bruits CARDIAC: RRR, no murmurs, rubs,  gallops RESPIRATORY:  Clear to auscultation without rales, wheezing or rhonchi  ABDOMEN: Soft, non-tender, non-distended EXTREMITIES:  No edema; No acute deformity   Asessement and Plan:.    1. Elevated troponin in possible cardiorespiratory arrest suspected due to accidental drug overdose - it was suspected that elevated troponin was due to demand ischemia which I agree with. Given circumstances of event, elevated troponin and family history of CAD, will proceed with cardiac CTA for definitive evaluation. He would like to return to light duty work in [employer] role which is mostly just walking and administrative duties. Since he has  been otherwise asymptomatic I think this is fine and wrote him a note to do so. I asked him to hold off progressing back to more aggressive exertion until coronary CTA is completed. I am hopeful that this was simply a supply/demand process since echocardiogram was reassuring. Has residual chest soreness likely from CPR but no anginal-type chest pain. Will await cor CTA before advising on ASA/statin, not started in the hospital.  2. Substance use - he reports this was a mistake, had been without insurance for 12 years dealing with chronic back pain, now plugged into primary care. He understands the importance of avoiding illicit substances. Also noted both ibuprofen  and naproxen  are listed on med list - discussed the duplicate class and advised not to use together on same day.  3. Prior tobacco use - congratulated on cessation.    Disposition: F/u with me in 3 months, sooner if cor CTA is abnormal.  Signed, Raphael LOISE Bring, PA-C

## 2024-01-04 ENCOUNTER — Ambulatory Visit: Attending: Physician Assistant | Admitting: Physician Assistant

## 2024-01-04 ENCOUNTER — Encounter: Payer: Self-pay | Admitting: Physician Assistant

## 2024-01-04 VITALS — BP 112/70 | HR 68 | Ht 74.0 in | Wt 190.0 lb

## 2024-01-04 DIAGNOSIS — R072 Precordial pain: Secondary | ICD-10-CM

## 2024-01-04 DIAGNOSIS — I469 Cardiac arrest, cause unspecified: Secondary | ICD-10-CM | POA: Diagnosis not present

## 2024-01-04 DIAGNOSIS — F1990 Other psychoactive substance use, unspecified, uncomplicated: Secondary | ICD-10-CM | POA: Diagnosis not present

## 2024-01-04 DIAGNOSIS — R7989 Other specified abnormal findings of blood chemistry: Secondary | ICD-10-CM

## 2024-01-04 DIAGNOSIS — Z87891 Personal history of nicotine dependence: Secondary | ICD-10-CM

## 2024-01-04 MED ORDER — METOPROLOL TARTRATE 100 MG PO TABS: 100.0000 mg | ORAL_TABLET | Freq: Once | ORAL | 0 refills | Status: DC

## 2024-01-04 NOTE — Patient Instructions (Addendum)
 There are two medicines in the same class on your medication list, ibuprofen  and naproxen . You should generally not take the both of them together on the same day. If you require them for different reasons, would just recommend to take as directed on different days and not mix.   Medication Instructions:    One time dose of Metoprolol tartrate  100 mg  tablet  *If you need a refill on your cardiac medications before your next appointment, please call your pharmacy*   Lab Work: Not needed    Testing/Procedures: Your physician has requested that you have coronary  CTA. Coronary computed tomography (CT)angiogram  is a special type of CT scan that uses a computer to produce multi-dimensional views of major blood vessels throughout the heart.  CT angiography, a contrast material is injected through an IV to help visualize the blood vessels  a painless test that uses an x-ray machine to take clear, detailed pictures of your heart arteries .  Please follow instruction sheet as given.    Follow-Up: At [facility], you and your health needs are our priority.  As part of our continuing mission to provide you with exceptional heart care, we have created designated Provider Care Teams.  These Care Teams include your primary Cardiologist (physician) and Advanced Practice Providers (APPs -  Physician Assistants and Nurse Practitioners) who all work together to provide you with the care you need, when you need it.     Your next appointment:   3 month(s)  The format for your next appointment:   In Person  Provider:   Dayna Dunn, PA-C         Other Instructions     Your cardiac CT will be scheduled at  the below locations:       [facility] [address]  [place], [place] [address] [phone]   [facility] at [address], please enter the parking lot using the [address] entrance and use the FREE valet service at the patient  drop-off area. Enter the building and check-in with registration on the main floor.   Please follow these instructions carefully (unless otherwise directed):  An IV will be required for this test and Nitroglycerin will be given.   Hold all erectile dysfunction medications at least 3 days (72 hrs) prior to test. (Ie viagra, cialis, sildenafil, tadalafil, etc)   On the Night Before the Test: Be sure to Drink plenty of water. Do not consume any caffeinated/decaffeinated beverages or chocolate 12 hours prior to your test. Do not take any antihistamines 12 hours prior to your test.    On the Day of the Test: Drink plenty of water until 1 hour prior to the test. Do not eat any food 1 hour prior to test. You may take your regular medications prior to the test.  Take metoprolol (Lopressor)  100 mg two hours prior to test.         After the Test: Drink plenty of water. After receiving IV contrast, you may experience a mild flushed feeling. This is normal. On occasion, you may experience a mild rash up to 24 hours after the test. This is not dangerous. If this occurs, you can take Benadryl 25 mg, Zyrtec, Claritin, or Allegra and increase your fluid intake. (Patients taking Tikosyn should avoid Benadryl, and may take Zyrtec, Claritin, or Allegra) If you experience trouble breathing, this can be serious. If it is severe call 911 IMMEDIATELY. If  it is mild, please call our office.  We will call to schedule your test 2-4 weeks out understanding that some insurance companies will need an authorization prior to the service being performed.   For more information and frequently asked questions, please visit our website : [email or web address]  For non-scheduling related questions, please contact the cardiac imaging nurse navigator should you have any questions/concerns: Cardiac Imaging Nurse Navigators Direct Office Dial: [phone]   For scheduling needs, including cancellations  and rescheduling, please call [place], [phone].

## 2024-01-10 ENCOUNTER — Encounter (HOSPITAL_COMMUNITY): Payer: Self-pay

## 2024-01-11 ENCOUNTER — Ambulatory Visit (HOSPITAL_COMMUNITY)
Admission: RE | Admit: 2024-01-11 | Discharge: 2024-01-11 | Disposition: A | Discharge status: Home / Self Care | Source: Ambulatory Visit | Attending: Physician Assistant | Admitting: Physician Assistant

## 2024-01-11 DIAGNOSIS — R7989 Other specified abnormal findings of blood chemistry: Secondary | ICD-10-CM | POA: Insufficient documentation

## 2024-01-11 DIAGNOSIS — R072 Precordial pain: Secondary | ICD-10-CM | POA: Diagnosis present

## 2024-01-11 DIAGNOSIS — F1990 Other psychoactive substance use, unspecified, uncomplicated: Secondary | ICD-10-CM | POA: Diagnosis present

## 2024-01-11 MED ORDER — IOHEXOL 350 MG/ML SOLN
100.0000 mL | Freq: Once | INTRAVENOUS | Status: AC | PRN
  Administered 2024-01-11: 100 mL via INTRAVENOUS

## 2024-01-11 MED ORDER — NITROGLYCERIN 0.4 MG SL SUBL
0.8000 mg | SUBLINGUAL_TABLET | Freq: Once | SUBLINGUAL | Status: AC
  Administered 2024-01-11: 0.8 mg via SUBLINGUAL

## 2024-01-12 ENCOUNTER — Ambulatory Visit: Payer: Self-pay | Admitting: Physician Assistant

## 2024-01-12 ENCOUNTER — Ambulatory Visit (HOSPITAL_COMMUNITY): Admission: RE | Admit: 2024-01-12 | Source: Ambulatory Visit

## 2024-01-14 ENCOUNTER — Ambulatory Visit: Admitting: Family Medicine

## 2024-04-10 NOTE — Progress Notes (Unsigned)
 "  Cardiology Office Note    Date:  04/11/2024  ID:  [name], DOB Oct 21, 1975, MRN [id number] PCP:  [name], MD  Cardiologist:  Vinie JAYSON Maxcy, MD  Electrophysiologist:  None   Chief Complaint: f/u arrest event  History of Present Illness: .    Dean Palmer is a 49 y.o. male with visit-pertinent history of GERD, bipolar 1, anxiety, depression, history of alcoholism (quit 2 years ago), prior tobacco use recently quit, DDD seen for post-hospital follow-up. Mother had stent in her 19s.  He was admitted 12/16/23 with episode of unresponsiveness/possible cardiorespiratory arrest improved with Narcan suspected to be due to synthetic opioids that would not show up on UDS. Per consult note,  He presented to the [facility] emergency department on 12/16/2023 via EMS after being found down and unresponsive at work.  EMT reported that upon arrival to the scene he was pulseless, apneic, CPR was started 3 total rounds were completed.  EMS then arrived and noted patient to have good pulses, agonal breaths.  2.5 mg of Narcan was administered.  Patient reports that he had taken a little blue pill from someone at work, unaware what drug it is. ED notes indicate he became responsive once Narcan was adminstered. Troponins were elevated to a peak 157. 2D echo showed EF 60-65%, no significant abnormalities, recommended for ouptatient followup. Outpatient cor CTA 12/2023 showed CAC 0, overread with unfused sternal apophysis versus ununited remote fracture (advised to f/u PCP for this finding).  He is seen for follow-up doing great. He denies any recurrent arrest events. No CP, SOB, palpitations, near-syncope, syncope, edema or any complaints. Remains abstinent of alcohol and illicit drug use. Has significantly cut down tobacco use to 1-2 cigarettes only sporadically.    Labwork independently reviewed: 12/2023 CBC wnl 11/2023 LDL 86, trig 42, K 4.4, Cr 1.01, LFTs ok 2015 TSH  ROS: .    Please see  the history of present illness.  All other systems are reviewed and otherwise negative.  Studies Reviewed: SABRA    EKG:  EKG is not ordered today  CV Studies: Cardiac studies reviewed are outlined and summarized above. Otherwise please see EMR for full report.   Current Reported Medications:.    Active Medications[1] Does not take naproxen  anymore really - knows not to take ibuprofen /naproxen  together  Physical Exam:    VS:  BP 122/70   Pulse 92   Resp 14   Ht 6' 2 (1.88 m)   Wt 210 lb 9.6 oz (95.5 kg)   SpO2 98%   BMI 27.04 kg/m    Wt Readings from Last 3 Encounters:  04/11/24 210 lb 9.6 oz (95.5 kg)  01/04/24 190 lb (86.2 kg)  12/24/23 188 lb 6.4 oz (85.5 kg)    GEN: Well nourished, well developed in no acute distress NECK: No JVD; No carotid bruits CARDIAC: RRR, no murmurs, rubs, gallops RESPIRATORY:  Clear to auscultation without rales, wheezing or rhonchi  ABDOMEN: Soft, non-tender, non-distended EXTREMITIES:  No edema; No acute deformity   Asessement and Plan:.    1. Elevated troponin with possible cardiorespiratory arrest 11/2023 - elevated troponin was suspected due to demand ischemia in the setting of accidental opioid overdose. Cardiac testing has been reassuring, without evidence for underlying structural or coronary artery disease. He has not had any recurrent events and has no concerning symptoms. At this point no additional cardiac testing is warranted and he can f/u cardiology PRN.  2. Prior substance/tobacco abuse -  congratulated on abstinence from ETOH/drug use. Has significantly cut down on smoking as well. Importance of cessation reinforced.    Disposition: F/u with cardiology PRN.  Signed, Bryson Palen N Jennene Downie, PA-C      [1]  Current Meds  Medication Sig   acetaminophen  (TYLENOL ) 500 MG tablet Take 500-1,000 mg by mouth every 6 (six) hours as needed for moderate pain (pain score 4-6).   ibuprofen  (ADVIL ,MOTRIN ) 600 MG tablet Take 1 tablet (600 mg total)  by mouth every 6 (six) hours as needed.   naproxen  (NAPROSYN ) 500 MG tablet Take 1 tablet (500 mg total) by mouth 2 (two) times daily as needed. Take with meals   Vitamin D , Cholecalciferol , 25 MCG (1000 UT) TABS Take 1 tablet by mouth daily.   "

## 2024-04-11 ENCOUNTER — Encounter: Payer: Self-pay | Admitting: Physician Assistant

## 2024-04-11 ENCOUNTER — Ambulatory Visit: Attending: Physician Assistant | Admitting: Physician Assistant

## 2024-04-11 VITALS — BP 122/70 | HR 92 | Resp 14 | Ht 74.0 in | Wt 210.6 lb

## 2024-04-11 DIAGNOSIS — Z87898 Personal history of other specified conditions: Secondary | ICD-10-CM | POA: Diagnosis not present

## 2024-04-11 DIAGNOSIS — I469 Cardiac arrest, cause unspecified: Secondary | ICD-10-CM

## 2024-04-11 DIAGNOSIS — R7989 Other specified abnormal findings of blood chemistry: Secondary | ICD-10-CM | POA: Diagnosis not present

## 2024-04-11 NOTE — Patient Instructions (Signed)
 Medication Instructions:  Continue as directed.  *If you need a refill on your cardiac medications before your next appointment, please call your pharmacy*   Follow-Up: At [facility], you and your health needs are our priority.  As part of our continuing mission to provide you with exceptional heart care, our providers are all part of one team.  This team includes your primary Cardiologist (physician) and Advanced Practice Providers or APPs (Physician Assistants and Nurse Practitioners) who all work together to provide you with the care you need, when you need it.  Your next appointment:   Follow-up as needed  Provider:   One of our Advanced Practice Providers (APPs): Morse Clause, PA-C  Lamarr Satterfield, NP Miriam Shams, NP  Olivia Pavy, PA-C Josefa Beauvais, NP  Leontine Salen, PA-C Orren Fabry, PA-C  Nicholson, PA-C Ernest Dick, NP  Damien Braver, NP Jon Hails, PA-C  Waddell Donath, PA-C    Dayna Dunn, PA-C  Scott Weaver, PA-C Lum Louis, NP Katlyn West, NP Callie Goodrich, PA-C  Xika Zhao, NP Sheng Haley, PA-C    Kathleen Dwayne Bulkley, PA-C   We recommend signing up for the patient portal called MyChart.  Sign up information is provided on this After Visit Summary.  MyChart is used to connect with patients for Virtual Visits (Telemedicine).  Patients are able to view lab/test results, encounter notes, upcoming appointments, etc.  Non-urgent messages can be sent to your provider as well.   To learn more about what you can do with MyChart, go to [email or web address].
